# Patient Record
Sex: Female | Born: 2000 | Race: White | Hispanic: No | Marital: Single | Smoking: Current every day smoker
Health system: Southern US, Community
[De-identification: ages and names within clinical notes are randomized; demographics above are authoritative.]

## PROBLEM LIST (undated history)

## (undated) DIAGNOSIS — F419 Anxiety disorder, unspecified: Secondary | ICD-10-CM

## (undated) DIAGNOSIS — E079 Disorder of thyroid, unspecified: Secondary | ICD-10-CM

## (undated) DIAGNOSIS — F32A Depression, unspecified: Secondary | ICD-10-CM

## (undated) DIAGNOSIS — F329 Major depressive disorder, single episode, unspecified: Secondary | ICD-10-CM

## (undated) HISTORY — PX: CHOLECYSTECTOMY: SHX55

## (undated) HISTORY — PX: SEPTOPLASTY: SUR1290

## (undated) HISTORY — DX: Anxiety disorder, unspecified: F41.9

## (undated) HISTORY — PX: BUNIONECTOMY: SHX129

## (undated) HISTORY — PX: FRACTURE SURGERY: SHX138

---
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
## 2020-06-09 ENCOUNTER — Other Ambulatory Visit: Payer: Self-pay

## 2020-06-09 ENCOUNTER — Ambulatory Visit: Payer: Self-pay

## 2020-06-09 ENCOUNTER — Encounter: Payer: Self-pay | Admitting: Emergency Medicine

## 2020-06-09 ENCOUNTER — Ambulatory Visit
Admission: EM | Admit: 2020-06-09 | Discharge: 2020-06-09 | Disposition: A | Discharge status: Home / Self Care | Payer: BC Managed Care – PPO

## 2020-06-09 DIAGNOSIS — L0591 Pilonidal cyst without abscess: Secondary | ICD-10-CM | POA: Diagnosis not present

## 2020-06-09 HISTORY — DX: Disorder of thyroid, unspecified: E07.9

## 2020-06-09 HISTORY — DX: Depression, unspecified: F32.A

## 2020-06-09 MED ORDER — DOXYCYCLINE HYCLATE 100 MG PO CAPS: 100.0000 mg | ORAL_CAPSULE | Freq: Two times a day (BID) | ORAL | 0 refills | Status: DC

## 2020-06-09 NOTE — Discharge Instructions (Addendum)
Keep area(s) clean and dry. Apply hot compress / towel for 5-10 minutes 3-5 times daily. Take antibiotic as prescribed with food - important to complete course. Return for worsening pain, redness, swelling, discharge, fever 

## 2020-06-10 ENCOUNTER — Encounter: Payer: Self-pay | Admitting: Emergency Medicine

## 2020-06-10 NOTE — ED Provider Notes (Signed)
[facility]    CSN: [id number] Arrival date & time: 06/09/20  1659      History   Chief Complaint Chief Complaint  Patient presents with  . Abscess    HPI Myalynn Lingle is a 19 y.o. female.   With history of obesity presenting for gluteal cleft pain, swelling.  Denies history of pilonidal cyst, though does have history of abscesses.  No change in bowel or bladder habit, abdominal pain, back pain, fever.  Has not tried thing for this.  Past Medical History:  Diagnosis Date  . Depression   . Thyroid disease     There are no problems to display for this patient.   History reviewed. No pertinent surgical history.  OB History   No obstetric history on file.      Home Medications    Prior to Admission medications   Medication Sig Start Date End Date Taking? Authorizing Provider  ARIPiprazole (ABILIFY) 15 MG tablet Take 15 mg by mouth daily.   Yes [provider]  levothyroxine (SYNTHROID) 100 MCG tablet Take 100 mcg by mouth daily before breakfast.   Yes [provider]  doxycycline (VIBRAMYCIN) 100 MG capsule Take 1 capsule (100 mg total) by mouth 2 (two) times daily. 06/09/20   Hall-Potvin, [place], PA-C    Family History History reviewed. No pertinent family history.  Social History Social History   Tobacco Use  . Smoking status: Never Smoker  . Smokeless tobacco: Never Used  Substance Use Topics  . Alcohol use: Not Currently  . Drug use: Never     Allergies   Patient has no known allergies.   Review of Systems As per HPI   Physical Exam Triage Vital Signs ED Triage Vitals  Enc Vitals Group     BP 06/09/20 1731 (!) 146/82     Pulse Rate 06/09/20 1731 77     Resp 06/09/20 1731 18     Temp 06/09/20 1731 98.4 F (36.9 C)     Temp Source 06/09/20 1731 Oral     SpO2 06/09/20 1731 95 %     Weight --      Height --      Head Circumference --      Peak Flow --      Pain Score 06/09/20 1732 7     Pain Loc --       Pain Edu? --      Excl. in GC? --    No data found.  Updated Vital Signs BP (!) 146/82 (BP Location: Right Arm)   Pulse 77   Temp 98.4 F (36.9 C) (Oral)   Resp 18   SpO2 95%   Visual Acuity Right Eye Distance:   Left Eye Distance:   Bilateral Distance:    Right Eye Near:   Left Eye Near:    Bilateral Near:     Physical Exam Constitutional:      General: She is not in acute distress. HENT:     Head: Normocephalic and atraumatic.  Eyes:     General: No scleral icterus.    Pupils: Pupils are equal, round, and reactive to light.  Cardiovascular:     Rate and Rhythm: Normal rate.  Pulmonary:     Effort: Pulmonary effort is normal.  Skin:    Coloration: Skin is not jaundiced or pale.     Comments: 1 cm area of induration at top of gluteal cleft.  No surrounding erythema or warmth, though patient does have  TTP.  Neurological:     Mental Status: She is alert and oriented to person, place, and time.      UC Treatments / Results  Labs (all labs ordered are listed, but only abnormal results are displayed) Labs Reviewed - No data to display  EKG   Radiology No results found.  Procedures Procedures (including critical care time)  Medications Ordered in UC Medications - No data to display  Initial Impression / Assessment and Plan / UC Course  I have reviewed the triage vital signs and the nursing notes.  Pertinent labs & imaging results that were available during my care of the patient were reviewed by me and considered in my medical decision making (see chart for details).     Patient with pilonidal cyst: We will start doxycycline to cover for possible underlying infection.  Lesion is small and indurated: We will defer I&D at this time.  Provided contact information for surgical follow-up if needed.  Return precautions discussed, pt verbalized understanding and is agreeable to plan. Final Clinical Impressions(s) / UC Diagnoses   Final diagnoses:  Infected  pilonidal cyst     Discharge Instructions     Keep area(s) clean and dry. Apply hot compress / towel for 5-10 minutes 3-5 times daily. Take antibiotic as prescribed with food - important to complete course. Return for worsening pain, redness, swelling, discharge, fever.    ED Prescriptions    Medication Sig Dispense Auth. Provider   doxycycline (VIBRAMYCIN) 100 MG capsule Take 1 capsule (100 mg total) by mouth 2 (two) times daily. 20 capsule Hall-Potvin, [place], PA-C     PDMP not reviewed this encounter.   Hall-Potvin, [place], [place] 06/10/20 1338

## 2020-11-16 ENCOUNTER — Ambulatory Visit (HOSPITAL_COMMUNITY)
Admission: RE | Admit: 2020-11-16 | Discharge: 2020-11-16 | Disposition: A | Discharge status: Home / Self Care | Payer: 59 | Attending: Psychiatry | Admitting: Psychiatry

## 2020-11-16 MED ORDER — ARIPIPRAZOLE 5 MG PO TABS
5.0000 mg | ORAL_TABLET | Freq: Every day | ORAL | 0 refills | Status: DC
Start: 2020-11-17 — End: 2021-01-20

## 2020-11-16 MED ORDER — MIRTAZAPINE 30 MG PO TABS
30.0000 mg | ORAL_TABLET | Freq: Every day | ORAL | Status: DC
  Filled 2020-11-16: qty 1

## 2020-11-16 MED ORDER — ARIPIPRAZOLE 5 MG PO TABS
5.0000 mg | ORAL_TABLET | Freq: Every day | ORAL | Status: DC
  Filled 2020-11-16 (×2): qty 1

## 2020-11-16 MED ORDER — MIRTAZAPINE 30 MG PO TABS: 30.0000 mg | ORAL_TABLET | Freq: Every day | ORAL | 0 refills | Status: DC

## 2020-11-16 NOTE — H&P (Signed)
Behavioral Health Medical Screening Exam  Amber Rivera is an 20 y.o. female, presenting for depression with suicidal ideation for one month. She denies any suicidal plan or intent and contracts for safety. She was seen previously by psychiatry and counseling but does not have any providers locally since moving to the area this past summer. She reports previously taking Remeron and Abilify which were helpful, but she recently ran out of refills. Denies HI/AVH. Denies access to firearms or weapons. We discussed PHP/IOP as options, but patient states these will not work with her work schedule. Will send Remeron and Abilify prescription to patient's pharmacy and provided with outpatient psychiatry and counseling referrals.  Total Time spent with patient: 20 minutes  Psychiatric Specialty Exam: Physical Exam Vitals reviewed.  Constitutional:      Appearance: She is well-developed and well-nourished.  Cardiovascular:     Rate and Rhythm: Normal rate.  Pulmonary:     Effort: Pulmonary effort is normal.  Neurological:     Mental Status: She is alert and oriented to person, place, and time.    Review of Systems  Constitutional: Negative.   Respiratory: Negative for cough and shortness of breath.   Psychiatric/Behavioral: Positive for dysphoric mood and suicidal ideas (no plan or intent). Negative for agitation, behavioral problems, confusion, decreased concentration, hallucinations, self-injury and sleep disturbance. The patient is nervous/anxious. The patient is not hyperactive.    Blood pressure 124/73, pulse 89, temperature 97.9 F (36.6 C), temperature source Oral, resp. rate 18, SpO2 99 %.There is no height or weight on file to calculate BMI. General Appearance: Casual Eye Contact:  Good Speech:  Normal Rate Volume:  Normal Mood:  Anxious and Depressed Affect:  Congruent Thought Process:  Coherent and Goal Directed Orientation:  Full (Time, Place, and Person) Thought Content:   Logical Suicidal Thoughts:  Yes.  without intent/plan Homicidal Thoughts:  No Memory:  Immediate;   Good Recent;   Good Remote;   Good Judgement:  Intact Insight:  Fair Psychomotor Activity:  Normal Concentration: Concentration: Good and Attention Span: Good Recall:  Good Fund of Knowledge:Fair Language: Good Akathisia:  No Handed:  Right AIMS (if indicated):    Assets:  Communication Skills Desire for Improvement Financial Resources/Insurance Housing Physical Health Social Support Sleep:     Musculoskeletal: Strength & Muscle Tone: within normal limits Gait & Station: normal Patient leans: N/A  Blood pressure 124/73, pulse 89, temperature 97.9 F (36.6 C), temperature source Oral, resp. rate 18, SpO2 99 %.  Recommendations: Based on my evaluation the patient does not appear to have an emergency medical condition.  Remeron and Abilify rx sent to [facility] on [address]. Outpatient referrals.  Aldean Baker, NP 11/16/2020, 12:40 PM

## 2020-11-16 NOTE — BH Assessment (Addendum)
Comprehensive Clinical Assessment (CCA) Note  Patient is a 20 y/o female with history of depression. States that she was at work today and "started to feel like I was under water". She experienced increased anxiety and became overwhelmed. She called her mother and made her aware of how she was feeling. Also, informed her mother that she felt suicidal. Patient was brought to [facility] by her mother for a crises assessment.   Patient with current suicidal ideations. No plan. No intent. No access to means. Protective factors from harming herself is the relationship she has with her family. She denies access to means and is able to to contract for safety. Reports a history of self mutilating behaviors (cutting) with a razor blade. The last incident occurred 7-8 yrs ago. Current depressive symptoms include: guilt, tearful, worthlessness, guilt, and isolating self from others. Appetite is poor. She sleeps 6-7 hrs per night and has difficulty falling asleep. She denies a family history of mental health illnesses. No history of trauma and/or abuse. Support system is her family. She currently lives with her parents and #2 siblings.  Works at a [employer] and has a H.S. Diploma.   She denies homicidal ideations. No history of aggressive and/or assaultive behaviors. Denies legal issues and/or criminal charges pending. No court dates. Denies AVH's. She reports daily THC use for the past 1-2 weeks. When asked her amount of use per day she was unable to answer. Denies alcohol use. Patient was [place] to [place] psychiatric facility in [place]  ---2017 (2 hospitalizations). The reason for the hospitalizations were due to suicidal ideations, depression, and self mutilating behaviors. She does not have a current outpatient therapist and/or psychiatrist. However, was seeing a provider when she lived in [place]. She has not seen a provider since July 2021. She has not had any psychotropic medications x3 months. States that  she ran out and doesn't have any more refills for a prescription. She was prescribed Remeron and Abilify.   Patient is oriented x5. Speech is normal. Affect is sad/deprsessed. Mood is congruent with affect.  Insight and judgement are fair.She does not appear to be responding to internal stimuli.   Disposition:  Marciano SequinJanet Sykes, NP, psych cleared patient for discharge home. We discussed PHP/IOP as options, but patient states these will not work with her work schedule. Marciano SequinJanet Sykes, NP, agreed to send Remeron and Abilify prescription to patient's pharmacy and she was provided with outpatient psychiatry and counseling referrals. Patient agreed to follow up with those referrals.         11/16/2020 Dietrich Patesierra Hufstetler [id number]  Chief Complaint:  Chief Complaint  Patient presents with  . Psychiatric Evaluation   Visit Diagnosis: Major Depressive Disorder, Recurrent, Severe without psychotic Features, Anxiety Disorder, Cannibas Use Disorder   CCA Screening, Triage and Referral (STR)  Patient Reported Information How did you hear about us? Family/Friend  Referral name: mother- Erick AlleyMichelle Settlemyre  Referral phone number: [phone])   Whom do you see for routine medical problems? I don't have a doctor  Practice/Facility Name: No data recorded Practice/Facility Phone Number: No data recorded Name of Contact: No data recorded Contact Number: No data recorded Contact Fax Number: No data recorded Prescriber Name: No data recorded Prescriber Address (if known): No data recorded  What Is the Reason for Your Visit/Call Today? Depressed and Suicidal; Ran out of psychotrophics 3 months ago.  How Long Has This Been Causing You Problems? > than 6 months  What Do You Feel Would  Help You the Most Today? No data recorded  Have You Recently Been in Any Inpatient Treatment (Hospital/Detox/Crisis Center/28-Day Program)? No  Name/Location of Program/Hospital:No data recorded How Long Were You There? No  data recorded When Were You Discharged? No data recorded  Have You Ever Received Services [facility]? No  Who Do You See at [facility]? No data recorded  Have You Recently Had Any Thoughts About Hurting Yourself? Yes  Are You Planning to Commit Suicide/Harm Yourself At This time? No   Have you Recently Had Thoughts About Hurting Someone Karolee Ohslse? No  Explanation: No data recorded  Have You Used Any Alcohol or Drugs in the Past 24 Hours? No  How Long Ago Did You Use Drugs or Alcohol? No data recorded What Did You Use and How Much? No data recorded  Do You Currently Have a Therapist/Psychiatrist? No  Name of Therapist/Psychiatrist: No data recorded  Have You Been Recently Discharged From Any Office Practice or Programs? No  Explanation of Discharge From Practice/Program: No data recorded    CCA Screening Triage Referral Assessment Type of Contact: No data recorded Is this Initial or Reassessment? No data recorded Date Telepsych consult ordered in [facility]:  No data recorded Time Telepsych consult ordered in [facility]:  No data recorded  Patient Reported Information Reviewed? Yes  Patient Left Without Being Seen? No data recorded Reason for Not Completing Assessment: No data recorded  Collateral Involvement: No data recorded  Does Patient Have a Court Appointed Legal Guardian? No data recorded Name and Contact of Legal Guardian: No data recorded If Minor and Not Living with Parent(s), Who has Custody? No data recorded Is CPS involved or ever been involved? Never  Is APS involved or ever been involved? Never   Patient Determined To Be At Risk for Harm To Self or Others Based on Review of Patient Reported Information or Presenting Complaint? No  Method: No data recorded Availability of Means: No data recorded Intent: No data recorded Notification Required: No data recorded Additional Information for Danger to Others Potential: No data recorded Additional Comments  for Danger to Others Potential: No data recorded Are There Guns or Other Weapons in Your Home? No data recorded Types of Guns/Weapons: No data recorded Are These Weapons Safely Secured?                            No data recorded Who Could Verify You Are Able To Have These Secured: No data recorded Do You Have any Outstanding Charges, Pending Court Dates, Parole/Probation? No data recorded Contacted To Inform of Risk of Harm To Self or Others: No data recorded  Location of Assessment: -- ([facility])   Does Patient Present under Involuntary Commitment? No  IVC Papers Initial File Date: No data recorded  [place]   Patient Currently Receiving the Following Services: -- (Patient does not have any current outpatient services)   Determination of Need: Urgent (48 hours)   Options For Referral: Intensive Outpatient Therapy; Medication Management; Outpatient Therapy; Partial Hospitalization     CCA Biopsychosocial Intake/Chief Complaint:  No data recorded Current Symptoms/Problems: Suicidal and Depressed; "I ran out of my medications 3 months ago"   Patient Reported Schizophrenia/Schizoaffective Diagnosis in Past: No   Strengths: unk  Preferences: unk  Abilities: unk   Type of Services Patient Feels are Needed: unk   Initial Clinical Notes/Concerns: unk   Mental Health Symptoms Depression:  Difficulty  Concentrating; Hopelessness; Fatigue; Change in energy/activity; Sleep (too much or little); Tearfulness; Increase/decrease in appetite; Worthlessness   Duration of Depressive symptoms: Less than two weeks   Mania:  None   Anxiety:   Difficulty concentrating; Worrying; Restlessness   Psychosis:  None   Duration of Psychotic symptoms: No data recorded  Trauma:  None   Obsessions:  None   Compulsions:  None   Inattention:  Forgetful   Hyperactivity/Impulsivity:  N/A   Oppositional/Defiant Behaviors:  None   Emotional Irregularity:   Chronic feelings of emptiness; Mood lability   Other Mood/Personality Symptoms:  No data recorded   Mental Status Exam Appearance and self-care  Stature:  Average   Weight:  Obese   Clothing:  Casual   Grooming:  Normal   Cosmetic use:  Age appropriate   Posture/gait:  Normal   Motor activity:  Not Remarkable   Sensorium  Attention:  Normal   Concentration:  Normal   Orientation:  X5   Recall/memory:  Normal   Affect and Mood  Affect:  Depressed; Flat   Mood:  Depressed   Relating  Eye contact:  Normal   Facial expression:  Depressed   Attitude toward examiner:  Uninterested   Thought and Language  Speech flow: Normal   Thought content:  Appropriate to Mood and Circumstances   Preoccupation:  None   Hallucinations:  None   Organization:  No data recorded  [employer] of Knowledge:  Average   Intelligence:  Average   Abstraction:  Normal   Judgement:  Good   Reality Testing:  Adequate   Insight:  Good   Decision Making:  Normal   Social Functioning  Social Maturity:  Responsible   Social Judgement:  Normal   Stress  Stressors:  Other (Comment); Transitions (patient does not identify with any stressors; mentions that she moved from [place] June 2022)   Coping Ability:  Normal   Skill Deficits:  Communication; Decision making   Supports:  Family; Friends/Service system     Religion: Religion/Spirituality How Might This Affect Treatment?: unk  Leisure/Recreation: Leisure / Recreation Do You Have Hobbies?:  (unk)  Exercise/Diet: Exercise/Diet Do You Exercise?:  (unk) Have You Gained or Lost A Significant Amount of Weight in the Past Six Months?:  (unk) Do You Follow a Special Diet?:  (unk) Do You Have Any Trouble Sleeping?:  (unk)   CCA Employment/Education Employment/Work Situation: Employment / Work Situation Employment situation: Employed Where is patient currently employed?: "I work at a [employer], [employer]" How long has patient been employed?: unk Patient's job has been impacted by current illness: Yes Describe how patient's job has been impacted: Patient states, "I felt like I was under water". She called her mother and left work early due to suicidal ideations. What is the longest time patient has a held a job?: unk Where was the patient employed at that time?: Yes Has patient ever been in the [employer]?: No  Education: Education Is Patient Currently Attending School?: No Last Grade Completed:  (completed HS) Name of High School: n/a Did Garment/textile technologist From [employer]?: Yes Did You Attend College?: No Did You Attend Graduate School?: No Did You Have Any Special Interests In School?: unk Did You Have An Individualized Education Program (IIEP):  (unk) Did You Have Any Difficulty At School?:  (unk) Patient's Education Has Been Impacted by Current Illness:  (unk)   CCA Family/Childhood History Family and Relationship History: Family history Are you sexually  active?:  (unk) What is your sexual orientation?: unk Has your sexual activity been affected by drugs, alcohol, medication, or emotional stress?: unk Does patient have children?: No  Childhood History:  Childhood History By whom was/is the patient raised?: Both parents Additional childhood history information: unk Description of patient's relationship with caregiver when they were a child: unk Patient's description of current relationship with people who raised him/her: unk How were you disciplined when you got in trouble as a child/adolescent?: unk Does patient have siblings?:  (unk) Did patient suffer any verbal/emotional/physical/sexual abuse as a child?:  (unk) Did patient suffer from severe childhood neglect?:  (unk) Has patient ever been sexually abused/assaulted/raped as an adolescent or adult?:  (unk) Was the patient ever a victim of a crime or a disaster?:  (unk) Witnessed domestic violence?:  (unk) Has  patient been affected by domestic violence as an adult?:  (unk)  Child/Adolescent Assessment:     CCA Substance Use Alcohol/Drug Use: Alcohol / Drug Use Pain Medications: SEE MAR Prescriptions: SEE MAR Over the Counter: SEE MAR History of alcohol / drug use?: Yes Longest period of sobriety (when/how long): unk Substance #1 Name of Substance 1: THC 1 - Age of First Use: 20 yrs old 1 - Amount (size/oz): "I don't know" 1 - Frequency: daily for the past 2 weeks 1 - Duration: on-going 1 - Last Use / Amount: 11/15/2020                       ASAM's:  Six Dimensions of Multidimensional Assessment  Dimension 1:  Acute Intoxication and/or Withdrawal Potential:      Dimension 2:  Biomedical Conditions and Complications:      Dimension 3:  Emotional, Behavioral, or Cognitive Conditions and Complications:     Dimension 4:  Readiness to Change:     Dimension 5:  Relapse, Continued use, or Continued Problem Potential:     Dimension 6:  Recovery/Living Environment:     ASAM Severity Score:    ASAM Recommended Level of Treatment:     Substance use Disorder (SUD)    Recommendations for Services/Supports/Treatments:    DSM5 Diagnoses: There are no problems to display for this patient.   Patient Centered Plan: Patient is on the following Treatment Plan(s):  Anxiety, Depression and Substance Abuse   Referrals to Alternative Service(s): Referred to Alternative Service(s):   Place:   Date:   Time:    Referred to Alternative Service(s):   Place:   Date:   Time:    Referred to Alternative Service(s):   Place:   Date:   Time:    Referred to Alternative Service(s):   Place:   Date:   Time:     Melynda Ripple, CounselorComprehensive Clinical Assessment (CCA) Screening, Triage and Referral Note  11/16/2020 Saadiya Wilfong [id number]  Chief Complaint:  Chief Complaint  Patient presents with  . Psychiatric Evaluation   Visit Diagnosis: Major Depressive Disorder, Recurrent, Severe  without psychotic Features, Anxiety Disorder, Cannibas Use Disorder  Patient Reported Information How did you hear about [place]? Family/Friend   Referral name: mother- Luciana Cammarata   Referral phone number: [phone])  Whom do you see for routine medical problems? I don't have a doctor   Practice/Facility Name: No data recorded  Practice/Facility Phone Number: No data recorded  Name of Contact: No data recorded  Contact Number: No data recorded  Contact Fax Number: No data recorded  Prescriber Name: No data recorded  Prescriber Address (if  known): No data recorded What Is the Reason for Your Visit/Call Today? Depressed and Suicidal; Ran out of psychotrophics 3 months ago.  How Long Has This Been Causing You Problems? > than 6 months  Have You Recently Been in Any Inpatient Treatment (Hospital/Detox/Crisis Center/28-Day Program)? No   Name/Location of Program/Hospital:No data recorded  How Long Were You There? No data recorded  When Were You Discharged? No data recorded Have You Ever Received Services From [facility] Before? No   Who Do You See at [facility]? No data recorded Have You Recently Had Any Thoughts About Hurting Yourself? Yes   Are You Planning to Commit Suicide/Harm Yourself At This time?  No  Have you Recently Had Thoughts About Hurting Someone Karolee Ohs? No   Explanation: No data recorded Have You Used Any Alcohol or Drugs in the Past 24 Hours? No   How Long Ago Did You Use Drugs or Alcohol?  No data recorded  What Did You Use and How Much? No data recorded What Do You Feel Would Help You the Most Today? No data recorded Do You Currently Have a Therapist/Psychiatrist? No   Name of Therapist/Psychiatrist: No data recorded  Have You Been Recently Discharged From Any Office Practice or Programs? No   Explanation of Discharge From Practice/Program:  No data recorded    CCA Screening Triage Referral Assessment Type of Contact: No data recorded  Is this  Initial or Reassessment? No data recorded  Date Telepsych consult ordered in [facility]:  No data recorded  Time Telepsych consult ordered in [facility]:  No data recorded Patient Reported Information Reviewed? Yes   Patient Left Without Being Seen? No data recorded  Reason for Not Completing Assessment: No data recorded Collateral Involvement: No data recorded Does Patient Have a Court Appointed Legal Guardian? No data recorded  Name and Contact of Legal Guardian:  No data recorded If Minor and Not Living with Parent(s), Who has Custody? No data recorded Is CPS involved or ever been involved? Never  Is APS involved or ever been involved? Never  Patient Determined To Be At Risk for Harm To Self or Others Based on Review of Patient Reported Information or Presenting Complaint? No   Method: No data recorded  Availability of Means: No data recorded  Intent: No data recorded  Notification Required: No data recorded  Additional Information for Danger to Others Potential:  No data recorded  Additional Comments for Danger to Others Potential:  No data recorded  Are There Guns or Other Weapons in Your Home?  No data recorded   Types of Guns/Weapons: No data recorded   Are These Weapons Safely Secured?                              No data recorded   Who Could Verify You Are Able To Have These Secured:    No data recorded Do You Have any Outstanding Charges, Pending Court Dates, Parole/Probation? No data recorded Contacted To Inform of Risk of Harm To Self or Others: No data recorded Location of Assessment: -- (walk in at [facility])  Does Patient Present under Involuntary Commitment? No   IVC Papers Initial File Date: No data recorded  [place] of Residence: [place]  Patient Currently Receiving the Following Services: -- (Patient does not have any current outpatient services)   Determination of Need: Urgent (48 hours)   Options For Referral: Intensive Outpatient Therapy; Medication Management; Outpatient  Therapy; Partial  Hospitalization   Melynda Ripple, Counselor

## 2021-01-20 ENCOUNTER — Encounter (HOSPITAL_COMMUNITY): Payer: Self-pay | Admitting: Psychiatry

## 2021-01-20 ENCOUNTER — Telehealth (INDEPENDENT_AMBULATORY_CARE_PROVIDER_SITE_OTHER): Payer: 59 | Admitting: Psychiatry

## 2021-01-20 DIAGNOSIS — F411 Generalized anxiety disorder: Secondary | ICD-10-CM | POA: Diagnosis not present

## 2021-01-20 DIAGNOSIS — F331 Major depressive disorder, recurrent, moderate: Secondary | ICD-10-CM | POA: Diagnosis not present

## 2021-01-20 MED ORDER — BUSPIRONE HCL 7.5 MG PO TABS: 7.5000 mg | ORAL_TABLET | Freq: Every day | ORAL | 0 refills | Status: DC

## 2021-01-20 MED ORDER — MIRTAZAPINE 30 MG PO TABS: 30.0000 mg | ORAL_TABLET | Freq: Every day | ORAL | 0 refills | Status: DC

## 2021-01-20 MED ORDER — ARIPIPRAZOLE 10 MG PO TABS
10.0000 mg | ORAL_TABLET | Freq: Every day | ORAL | 0 refills | Status: DC
Start: 2021-01-20 — End: 2022-05-10
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
## 2021-02-16 ENCOUNTER — Telehealth (HOSPITAL_COMMUNITY): Payer: 59 | Admitting: Psychiatry

## 2021-10-06 ENCOUNTER — Emergency Department (HOSPITAL_COMMUNITY)
Admission: EM | Admit: 2021-10-06 | Discharge: 2021-10-06 | Disposition: A | Discharge status: Home / Self Care | Payer: 59 | Attending: Emergency Medicine | Admitting: Emergency Medicine

## 2021-10-06 ENCOUNTER — Other Ambulatory Visit: Payer: Self-pay

## 2021-10-06 ENCOUNTER — Encounter (HOSPITAL_COMMUNITY): Payer: Self-pay | Admitting: *Deleted

## 2021-10-06 DIAGNOSIS — E860 Dehydration: Secondary | ICD-10-CM | POA: Diagnosis not present

## 2021-10-06 DIAGNOSIS — J020 Streptococcal pharyngitis: Secondary | ICD-10-CM | POA: Diagnosis present

## 2021-10-06 DIAGNOSIS — J029 Acute pharyngitis, unspecified: Secondary | ICD-10-CM

## 2021-10-06 LAB — CBC WITH DIFFERENTIAL/PLATELET
Abs Immature Granulocytes: 0.06 10*3/uL (ref 0.00–0.07)
Basophils Absolute: 0 10*3/uL (ref 0.0–0.1)
Basophils Relative: 0 %
Eosinophils Absolute: 0 10*3/uL (ref 0.0–0.5)
Eosinophils Relative: 0 %
HCT: 39.8 % (ref 36.0–46.0)
Hemoglobin: 13.2 g/dL (ref 12.0–15.0)
Immature Granulocytes: 1 %
Lymphocytes Relative: 10 %
Lymphs Abs: 1.3 10*3/uL (ref 0.7–4.0)
MCH: 29.3 pg (ref 26.0–34.0)
MCHC: 33.2 g/dL (ref 30.0–36.0)
MCV: 88.2 fL (ref 80.0–100.0)
Monocytes Absolute: 0.9 10*3/uL (ref 0.1–1.0)
Monocytes Relative: 7 %
Neutro Abs: 10.9 10*3/uL — ABNORMAL HIGH (ref 1.7–7.7)
Neutrophils Relative %: 82 %
Platelets: 270 10*3/uL (ref 150–400)
RBC: 4.51 MIL/uL (ref 3.87–5.11)
RDW: 13.4 % (ref 11.5–15.5)
WBC: 13.1 10*3/uL — ABNORMAL HIGH (ref 4.0–10.5)
nRBC: 0 % (ref 0.0–0.2)

## 2021-10-06 LAB — COMPREHENSIVE METABOLIC PANEL
ALT: 17 U/L (ref 0–44)
AST: 15 U/L (ref 15–41)
Albumin: 3.8 g/dL (ref 3.5–5.0)
Alkaline Phosphatase: 64 U/L (ref 38–126)
Anion gap: 11 (ref 5–15)
BUN: 7 mg/dL (ref 6–20)
CO2: 25 mmol/L (ref 22–32)
Calcium: 8.8 mg/dL — ABNORMAL LOW (ref 8.9–10.3)
Chloride: 97 mmol/L — ABNORMAL LOW (ref 98–111)
Creatinine, Ser: 0.58 mg/dL (ref 0.44–1.00)
GFR, Estimated: >60 mL/min (ref >60)
Glucose, Bld: 107 mg/dL — ABNORMAL HIGH (ref 70–99)
Potassium: 3.4 mmol/L — ABNORMAL LOW (ref 3.5–5.1)
Sodium: 133 mmol/L — ABNORMAL LOW (ref 135–145)
Total Bilirubin: 0.6 mg/dL (ref 0.3–1.2)
Total Protein: 8.3 g/dL — ABNORMAL HIGH (ref 6.5–8.1)

## 2021-10-06 LAB — MONONUCLEOSIS SCREEN: Mono Screen: NEGATIVE

## 2021-10-06 MED ORDER — ONDANSETRON HCL 4 MG/2ML IJ SOLN
4.0000 mg | Freq: Once | INTRAMUSCULAR | Status: AC
  Administered 2021-10-06: 4 mg via INTRAVENOUS
  Filled 2021-10-06: qty 2

## 2021-10-06 MED ORDER — SODIUM CHLORIDE 0.9 % IV BOLUS
1000.0000 mL | Freq: Once | INTRAVENOUS | Status: AC
  Administered 2021-10-06: 1000 mL via INTRAVENOUS

## 2021-10-06 MED ORDER — KETOROLAC TROMETHAMINE 30 MG/ML IJ SOLN
30.0000 mg | Freq: Once | INTRAMUSCULAR | Status: AC
  Administered 2021-10-06: 30 mg via INTRAVENOUS
  Filled 2021-10-06: qty 1

## 2021-10-06 MED ORDER — SODIUM CHLORIDE 0.9 % IV SOLN
2.0000 g | Freq: Once | INTRAVENOUS | Status: AC
  Administered 2021-10-06: 2 g via INTRAVENOUS
  Filled 2021-10-06: qty 20

## 2021-10-06 NOTE — ED Provider Notes (Signed)
[facility] Provider Note   CSN: [id number] Arrival date & time: 10/06/21  [id number]     History Chief Complaint  Patient presents with   Sore Throat    Amber Rivera is a 20 y.o. female.  Patient has been treated for strep pharyngitis since Monday.  She states she is having a hard time keeping fluids and and her throat is still hurting.  The history is provided by the patient and medical records. No language interpreter was used.  Sore Throat This is a recurrent problem. The current episode started more than 2 days ago. The problem occurs constantly. The problem has not changed since onset.Pertinent negatives include no chest pain, no abdominal pain and no headaches. Nothing aggravates the symptoms. Nothing relieves the symptoms. She has tried nothing for the symptoms. The treatment provided no relief.      Past Medical History:  Diagnosis Date   Depression    Thyroid disease     There are no problems to display for this patient.   Past Surgical History:  Procedure Laterality Date   CHOLECYSTECTOMY     FRACTURE SURGERY Right    SEPTOPLASTY       OB History     Gravida  0   Para  0   Term  0   Preterm  0   AB  0   Living  0      SAB  0   IAB  0   Ectopic  0   Multiple  0   Live Births  0           No family history on file.  Social History   Tobacco Use   Smoking status: Never   Smokeless tobacco: Never  Vaping Use   Vaping Use: Every day   Substances: Nicotine  Substance Use Topics   Alcohol use: Yes   Drug use: Yes    Types: Marijuana    Home Medications Prior to Admission medications   Medication Sig Start Date End Date Taking? Authorizing Provider  ARIPiprazole (ABILIFY) 10 MG tablet Take 1 tablet (10 mg total) by mouth daily. 01/20/21   Merian Capron, MD  busPIRone (BUSPAR) 7.5 MG tablet Take 1 tablet (7.5 mg total) by mouth daily. 01/20/21   Merian Capron, MD  levothyroxine (SYNTHROID) 100 MCG tablet  Take 100 mcg by mouth daily before breakfast.    [provider]  mirtazapine (REMERON) 30 MG tablet Take 1 tablet (30 mg total) by mouth at bedtime. 01/20/21   Merian Capron, MD    Allergies    Patient has no known allergies.  Review of Systems   Review of Systems  Constitutional:  Negative for appetite change and fatigue.  HENT:  Negative for congestion, ear discharge and sinus pressure.        Sore throat  Eyes:  Negative for discharge.  Respiratory:  Negative for cough.   Cardiovascular:  Negative for chest pain.  Gastrointestinal:  Negative for abdominal pain and diarrhea.  Genitourinary:  Negative for frequency and hematuria.  Musculoskeletal:  Negative for back pain.  Skin:  Negative for rash.  Neurological:  Negative for seizures and headaches.  Psychiatric/Behavioral:  Negative for hallucinations.    Physical Exam Updated Vital Signs BP 126/66    Pulse 82    Temp 98.9 F (37.2 C) (Oral)    Resp 18    Ht 5\' 2"  (1.575 m)    Wt 122.5 kg    LMP  (  Within Weeks)    SpO2 98%    BMI 49.38 kg/m   Physical Exam Vitals and nursing note reviewed.  Constitutional:      Appearance: She is well-developed.  HENT:     Head: Normocephalic.     Nose: Nose normal.     Mouth/Throat:     Comments: Mildly inflamed pharynx Eyes:     General: No scleral icterus.    Conjunctiva/sclera: Conjunctivae normal.  Neck:     Thyroid: No thyromegaly.  Cardiovascular:     Rate and Rhythm: Normal rate and regular rhythm.     Heart sounds: No murmur heard.   No friction rub. No gallop.  Pulmonary:     Breath sounds: No stridor. No wheezing or rales.  Chest:     Chest wall: No tenderness.  Abdominal:     General: There is no distension.     Tenderness: There is no abdominal tenderness. There is no rebound.  Musculoskeletal:        General: Normal range of motion.     Cervical back: Neck supple.  Lymphadenopathy:     Cervical: No cervical adenopathy.  Skin:    Findings: No  erythema or rash.  Neurological:     Mental Status: She is alert and oriented to person, place, and time.     Motor: No abnormal muscle tone.     Coordination: Coordination normal.  Psychiatric:        Behavior: Behavior normal.    ED Results / Procedures / Treatments   Labs (all labs ordered are listed, but only abnormal results are displayed) Labs Reviewed  CBC WITH DIFFERENTIAL/PLATELET - Abnormal; Notable for the following components:      Result Value   WBC 13.1 (*)    Neutro Abs 10.9 (*)    All other components within normal limits  COMPREHENSIVE METABOLIC PANEL - Abnormal; Notable for the following components:   Sodium 133 (*)    Potassium 3.4 (*)    Chloride 97 (*)    Glucose, Bld 107 (*)    Calcium 8.8 (*)    Total Protein 8.3 (*)    All other components within normal limits  MONONUCLEOSIS SCREEN    EKG None  Radiology No results found.  Procedures Procedures   Medications Ordered in ED Medications  sodium chloride 0.9 % bolus 1,000 mL (1,000 mLs Intravenous New Bag/Given 10/06/21 0914)  ondansetron (ZOFRAN) injection 4 mg (4 mg Intravenous Given 10/06/21 0917)  ketorolac (TORADOL) 30 MG/ML injection 30 mg (30 mg Intravenous Given 10/06/21 0919)  cefTRIAXone (ROCEPHIN) 2 g in sodium chloride 0.9 % 100 mL IVPB (2 g Intravenous New Bag/Given 10/06/21 0915)    ED Course  I have reviewed the triage vital signs and the nursing notes.  Pertinent labs & imaging results that were available during my care of the patient were reviewed by me and considered in my medical decision making (see chart for details).    MDM Rules/Calculators/A&P                           Patient with dehydration and pharyngitis.  Patient improved with fluids.  She will continue Zithromax and have her follow-up with her PCP as needed Final Clinical Impression(s) / ED Diagnoses Final diagnoses:  Sore throat  Strep pharyngitis  Dehydration    Rx / DC Orders ED Discharge Orders      None        Ivanell Deshotel,  Broadus John, MD 10/06/21 1032

## 2021-10-06 NOTE — ED Triage Notes (Signed)
Pt was diagnosed with strep on Tuesday by urgent care and placed on antibiotics. Pt reports her tonsils have continued to swell to the point she hasn't been able to swallow any food and when she does get liquid down she vomits. Pt reports she hasn't had any solid food since Monday.

## 2021-10-06 NOTE — Discharge Instructions (Signed)
Drink plenty of fluids and follow-up with your doctor if not improving.  Continue to finish the antibiotic

## 2022-02-17 ENCOUNTER — Ambulatory Visit (INDEPENDENT_AMBULATORY_CARE_PROVIDER_SITE_OTHER): Payer: 59

## 2022-02-17 ENCOUNTER — Other Ambulatory Visit (HOSPITAL_COMMUNITY)
Admission: RE | Admit: 2022-02-17 | Discharge: 2022-02-17 | Disposition: A | Discharge status: Home / Self Care | Payer: 59 | Source: Ambulatory Visit | Attending: Family Medicine | Admitting: Family Medicine

## 2022-02-17 ENCOUNTER — Ambulatory Visit (INDEPENDENT_AMBULATORY_CARE_PROVIDER_SITE_OTHER): Payer: 59 | Admitting: Family Medicine

## 2022-02-17 ENCOUNTER — Encounter: Payer: Self-pay | Admitting: Family Medicine

## 2022-02-17 VITALS — BP 127/65 | HR 89 | Temp 98.2°F | Ht 62.0 in | Wt 246.5 lb

## 2022-02-17 DIAGNOSIS — M533 Sacrococcygeal disorders, not elsewhere classified: Secondary | ICD-10-CM

## 2022-02-17 DIAGNOSIS — F419 Anxiety disorder, unspecified: Secondary | ICD-10-CM | POA: Diagnosis not present

## 2022-02-17 DIAGNOSIS — F339 Major depressive disorder, recurrent, unspecified: Secondary | ICD-10-CM | POA: Diagnosis not present

## 2022-02-17 DIAGNOSIS — E039 Hypothyroidism, unspecified: Secondary | ICD-10-CM

## 2022-02-17 DIAGNOSIS — Z7689 Persons encountering health services in other specified circumstances: Secondary | ICD-10-CM

## 2022-02-17 DIAGNOSIS — T7421XA Adult sexual abuse, confirmed, initial encounter: Secondary | ICD-10-CM | POA: Insufficient documentation

## 2022-02-17 DIAGNOSIS — Z30011 Encounter for initial prescription of contraceptive pills: Secondary | ICD-10-CM

## 2022-02-17 LAB — PREGNANCY, URINE: Preg Test, Ur: NEGATIVE

## 2022-02-17 MED ORDER — NORGESTIMATE-ETH ESTRADIOL 0.25-35 MG-MCG PO TABS: 1.0000 | ORAL_TABLET | Freq: Every day | ORAL | 3 refills | Status: DC

## 2022-02-17 MED ORDER — PROPRANOLOL HCL 10 MG PO TABS: 10.0000 mg | ORAL_TABLET | Freq: Three times a day (TID) | ORAL | 0 refills | Status: DC | PRN
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
## 2022-02-18 LAB — CMP14+EGFR
ALT: 44 IU/L — ABNORMAL HIGH (ref 0–32)
AST: 20 IU/L (ref 0–40)
Albumin/Globulin Ratio: 1.8 (ref 1.2–2.2)
Albumin: 4.4 g/dL (ref 3.9–5.0)
Alkaline Phosphatase: 85 IU/L (ref 44–121)
BUN/Creatinine Ratio: 11 (ref 9–23)
BUN: 7 mg/dL (ref 6–20)
Bilirubin Total: 0.3 mg/dL (ref 0.0–1.2)
CO2: 20 mmol/L (ref 20–29)
Calcium: 9.5 mg/dL (ref 8.7–10.2)
Chloride: 104 mmol/L (ref 96–106)
Creatinine, Ser: 0.66 mg/dL (ref 0.57–1.00)
Globulin, Total: 2.5 g/dL (ref 1.5–4.5)
Glucose: 83 mg/dL (ref 70–99)
Potassium: 4.4 mmol/L (ref 3.5–5.2)
Sodium: 137 mmol/L (ref 134–144)
Total Protein: 6.9 g/dL (ref 6.0–8.5)
eGFR: 126 mL/min/{1.73_m2} (ref >59)

## 2022-02-18 LAB — CBC WITH DIFFERENTIAL/PLATELET
Basophils Absolute: 0 10*3/uL (ref 0.0–0.2)
Basos: 0 %
EOS (ABSOLUTE): 0.3 10*3/uL (ref 0.0–0.4)
Eos: 3 %
Hematocrit: 40.1 % (ref 34.0–46.6)
Hemoglobin: 13.1 g/dL (ref 11.1–15.9)
Immature Grans (Abs): 0 10*3/uL (ref 0.0–0.1)
Immature Granulocytes: 0 %
Lymphocytes Absolute: 2.1 10*3/uL (ref 0.7–3.1)
Lymphs: 22 %
MCH: 28.2 pg (ref 26.6–33.0)
MCHC: 32.7 g/dL (ref 31.5–35.7)
MCV: 86 fL (ref 79–97)
Monocytes Absolute: 0.5 10*3/uL (ref 0.1–0.9)
Monocytes: 5 %
Neutrophils Absolute: 6.4 10*3/uL (ref 1.4–7.0)
Neutrophils: 70 %
Platelets: 327 10*3/uL (ref 150–450)
RBC: 4.65 x10E6/uL (ref 3.77–5.28)
RDW: 12.8 % (ref 11.7–15.4)
WBC: 9.3 10*3/uL (ref 3.4–10.8)

## 2022-02-18 LAB — RPR: RPR Ser Ql: NONREACTIVE

## 2022-02-18 LAB — HEPB+HEPC+HIV PANEL
HIV Screen 4th Generation wRfx: NONREACTIVE
Hep B C IgM: NEGATIVE
Hep B Core Total Ab: NEGATIVE
Hep B E Ab: NEGATIVE
Hep B E Ag: NEGATIVE
Hep B Surface Ab, Qual: NONREACTIVE
Hep C Virus Ab: NONREACTIVE
Hepatitis B Surface Ag: NEGATIVE

## 2022-02-18 LAB — LIPID PANEL
Chol/HDL Ratio: 3.9 ratio (ref 0.0–4.4)
Cholesterol, Total: 174 mg/dL (ref 100–199)
HDL: 45 mg/dL (ref >39)
LDL Chol Calc (NIH): 116 mg/dL — ABNORMAL HIGH (ref 0–99)
Triglycerides: 67 mg/dL (ref 0–149)
VLDL Cholesterol Cal: 13 mg/dL (ref 5–40)

## 2022-02-18 LAB — THYROID PANEL WITH TSH
Free Thyroxine Index: 1.4 (ref 1.2–4.9)
T3 Uptake Ratio: 25 % (ref 24–39)
T4, Total: 5.6 ug/dL (ref 4.5–12.0)
TSH: 4.88 u[IU]/mL — ABNORMAL HIGH (ref 0.450–4.500)

## 2022-02-18 LAB — HSV(HERPES SIMPLEX VRS) I + II AB-IGG
HSV 1 Glycoprotein G Ab, IgG: 13.1 index — ABNORMAL HIGH (ref 0.00–0.90)
HSV 2 IgG, Type Spec: <0.91 index (ref 0.00–0.90)

## 2022-02-20 ENCOUNTER — Other Ambulatory Visit: Payer: Self-pay | Admitting: Family Medicine

## 2022-02-20 DIAGNOSIS — E039 Hypothyroidism, unspecified: Secondary | ICD-10-CM

## 2022-02-20 MED ORDER — LEVOTHYROXINE SODIUM 25 MCG PO TABS: 25.0000 ug | ORAL_TABLET | Freq: Every day | ORAL | 3 refills | Status: DC

## 2022-02-21 ENCOUNTER — Other Ambulatory Visit: Payer: Self-pay | Admitting: Family Medicine

## 2022-02-21 DIAGNOSIS — A749 Chlamydial infection, unspecified: Secondary | ICD-10-CM

## 2022-02-21 LAB — URINE CYTOLOGY ANCILLARY ONLY
Chlamydia: POSITIVE — AB
Comment: NEGATIVE
Comment: NEGATIVE
Comment: NORMAL
Neisseria Gonorrhea: NEGATIVE
Trichomonas: NEGATIVE

## 2022-02-21 MED ORDER — DOXYCYCLINE HYCLATE 100 MG PO TABS: 100.0000 mg | ORAL_TABLET | Freq: Two times a day (BID) | ORAL | 0 refills | Status: AC

## 2022-03-12 ENCOUNTER — Telehealth: Payer: 59 | Admitting: Family

## 2022-03-12 DIAGNOSIS — R300 Dysuria: Secondary | ICD-10-CM

## 2022-03-12 NOTE — Progress Notes (Signed)
Based on what you shared with me, I feel your condition warrants further evaluation and I recommend that you be seen in a face to face visit.  Given you were recently treated for Chlamydia and now having UTI symptoms, you need to be seen in person to have urine test completed.    NOTE: There will be NO CHARGE for this eVisit   If you are having a true medical emergency please call 911.      For an urgent face to face visit, [place] has six urgent care centers for your convenience:     [facility] Health Urgent Care Center at [facility] Directions [phone] [address] [place], [place] [address]    [facility] Health Urgent Care Center [facility]) Get Driving Directions [phone] [address] [place], [place] [address]  [facility] Health Urgent Care Center [facility]) Get Driving Directions [phone] [address] [place],  [place]  [address]  [facility] Health Urgent Care at [facility] Get Driving Directions [phone] [address] [place], [place] [address]   [facility] Health Urgent Care at [facility] Get Driving Directions  [phone] [address] [place], [place] [address]   [facility] Health Urgent Care at [facility] Directions [phone] [address] [place], [place] [address]  Your MyChart E-visit questionnaire answers were reviewed by a board certified advanced clinical practitioner to complete your personal care plan based on your specific symptoms.  Thank you for using e-Visits.

## 2022-03-28 ENCOUNTER — Encounter: Payer: Self-pay | Admitting: Family Medicine

## 2022-04-12 ENCOUNTER — Ambulatory Visit (INDEPENDENT_AMBULATORY_CARE_PROVIDER_SITE_OTHER): Payer: 59 | Admitting: Family Medicine

## 2022-04-12 ENCOUNTER — Encounter: Payer: Self-pay | Admitting: Family Medicine

## 2022-04-12 VITALS — BP 106/56 | HR 70 | Temp 97.8°F | Ht 62.0 in | Wt 239.4 lb

## 2022-04-12 DIAGNOSIS — F339 Major depressive disorder, recurrent, unspecified: Secondary | ICD-10-CM | POA: Diagnosis not present

## 2022-04-12 DIAGNOSIS — F419 Anxiety disorder, unspecified: Secondary | ICD-10-CM

## 2022-04-12 DIAGNOSIS — W19XXXA Unspecified fall, initial encounter: Secondary | ICD-10-CM | POA: Diagnosis not present

## 2022-04-12 DIAGNOSIS — M79642 Pain in left hand: Secondary | ICD-10-CM

## 2022-04-12 MED ORDER — QUETIAPINE FUMARATE 50 MG PO TABS: 50.0000 mg | ORAL_TABLET | Freq: Every day | ORAL | 1 refills | Status: DC

## 2022-04-12 NOTE — Progress Notes (Signed)
Established Patient Office Visit  Subjective   Patient ID: Amber Rivera, female    DOB: 2001-07-05  Age: 21 y.o. MRN: [id number]  Chief Complaint  Patient presents with   Depression   Anxiety   HPI Amber Rivera is here for a follow up of anxiety and depression. She has been managed by psychiatry through talkspace. She was started on seroquel about 1 month ago. She had been on ability 10 mg daily for a few months and they decreased this down to 5 mg daily at the same time due to increased irritability. She reports improvement with this regimen. She continues to have thoughts that she would be better off dead of times but denies any plan or intent to harm herself or others. She is about to run out of her Seroquel and does not have refills available. She has contacted her psychiatrist through talk space but the provider is out on vacation so she is unable to get a refill through this avenue.   She also reports a fall yesterday. She tripped in her basement and landed on her left hand on the concrete. There is some mild erythema. It is tender at the base of her pink. She denies numbness, tingling, wound, or decreased ROM.      04/12/2022   11:00 AM 01/20/2021   11:23 AM  Depression screen PHQ 2/9  Decreased Interest 2   Down, Depressed, Hopeless 2   PHQ - 2 Score 4   Altered sleeping 2   Tired, decreased energy 3   Change in appetite 3   Feeling bad or failure about yourself  2   Trouble concentrating 2   Moving slowly or fidgety/restless 2   Suicidal thoughts 2   PHQ-9 Score 20   Difficult doing work/chores Very difficult      Information is confidential and restricted. Go to Review Flowsheets to unlock data.      04/12/2022   11:01 AM  GAD 7 : Generalized Anxiety Score  Nervous, Anxious, on Edge 2  Control/stop worrying 2  Worry too much - different things 2  Trouble relaxing 2  Restless 1  Easily annoyed or irritable 3  Afraid - awful might happen 1  Total GAD 7 Score 13   Anxiety Difficulty Very difficult    Past Medical History:  Diagnosis Date   Anxiety    Depression    Thyroid disease       Review of Systems  Psychiatric/Behavioral:  Positive for depression.       Objective:     BP (!) 106/56   Pulse 70   Temp 97.8 F (36.6 C) (Temporal)   Ht 5\' 2"  (1.575 m)   Wt 239 lb 6 oz (108.6 kg)   SpO2 96%   BMI 43.78 kg/m    Physical Exam Vitals and nursing note reviewed.  Constitutional:      General: She is not in acute distress.    Appearance: She is not ill-appearing, toxic-appearing or diaphoretic.  Pulmonary:     Effort: Pulmonary effort is normal. No respiratory distress.  Musculoskeletal:     Left hand: Tenderness (Between 4th and 5th fingers) present. No swelling, deformity or bony tenderness. Normal range of motion. Normal strength.  Skin:    General: Skin is warm and dry.  Neurological:     General: No focal deficit present.     Mental Status: She is alert.  Psychiatric:        Mood and Affect: Mood normal.  Behavior: Behavior normal.        Thought Content: Thought content normal.        Judgment: Judgment normal.      No results found for any visits on 04/12/22.    The ASCVD Risk score (Arnett DK, et al., 2019) failed to calculate for the following reasons:   The 2019 ASCVD risk score is only valid for ages 14 to 54    Assessment & Plan:   Ndea was seen today for depression and anxiety.  Diagnoses and all orders for this visit:  Anxiety Depression, recurrent (HCC) Uncontrolled. Managed by psychiatry but provider is currently unavailable. Denies suicidal plan or intent. Given contact information for local psychiatry office where referral has been authorized. Also given info for [facility] with [facility]. Discussed will need to have psychiatry manage given her history of bipolar disorder, however I will fill in the gaps as needed. Encouraged her to reach out to me anytime.  -     QUEtiapine (SEROQUEL) 50 MG  tablet; Take 1 tablet (50 mg total) by mouth at bedtime.  Fall, initial encounter Left hand pain Discussed RICE therapy. Discussed if symptoms persist or worsen, will order an xray.    Return in about 3 months (around 07/13/2022) for chronic follow up. Return to office for new or worsening symptoms, or if symptoms persist.   The patient indicates understanding of these issues and agrees with the plan.   Gabriel Earing, FNP

## 2022-04-12 NOTE — Patient Instructions (Addendum)
New Behavioral Health Urgent Care for [facility] Residents For 24/7 walk-up access to mental health services for [facility] children (4+), adolescents and adults, please visit the new [facility] located at [address] in [place], [place] as well as [address] [place], [place] [address] HelpLine: [phone] or [phone]  [facility] [address] [place], [place] Amber Rivera [phone]   Managing Anxiety, Adult After being diagnosed with anxiety, you may be relieved to know why you have felt or behaved a certain way. You may also feel overwhelmed about the treatment ahead and what it will mean for your life. With care and support, you can manage this condition. How to manage lifestyle changes Managing stress and anxiety  Stress is your body's reaction to life changes and events, both good and bad. Most stress will last just a few hours, but stress can be ongoing and can lead to more than just stress. Although stress can play a major role in anxiety, it is not the same as anxiety. Stress is usually caused by something external, such as a deadline, test, or competition. Stress normally passes after the triggering event has ended.  Anxiety is caused by something internal, such as imagining a terrible outcome or worrying that something will go wrong that will devastate you. Anxiety often does not go away even after the triggering event is over, and it can become long-term (chronic) worry. It is important to understand the differences between stress and anxiety and to manage your stress effectively so that it does not lead to an anxious response. Talk with your health care provider or a counselor to learn more about reducing anxiety and stress. He or she may suggest tension reduction techniques, such as: Music therapy. Spend time creating or listening to music that you enjoy and that inspires you. Mindfulness-based  meditation. Practice being aware of your normal breaths while not trying to control your breathing. It can be done while sitting or walking. Centering prayer. This involves focusing on a word, phrase, or sacred image that means something to you and brings you peace. Deep breathing. To do this, expand your stomach and inhale slowly through your nose. Hold your breath for 3-5 seconds. Then exhale slowly, letting your stomach muscles relax. Self-talk. Learn to notice and identify thought patterns that lead to anxiety reactions and change those patterns to thoughts that feel peaceful. Muscle relaxation. Taking time to tense muscles and then relax them. Choose a tension reduction technique that fits your lifestyle and personality. These techniques take time and practice. Set aside 5-15 minutes a day to do them. Therapists can offer counseling and training in these techniques. The training to help with anxiety may be covered by some insurance plans. Other things you can do to manage stress and anxiety include: Keeping a stress diary. This can help you learn what triggers your reaction and then learn ways to manage your response. Thinking about how you react to certain situations. You may not be able to control everything, but you can control your response. Making time for activities that help you relax and not feeling guilty about spending your time in this way. Doing visual imagery. This involves imagining or creating mental pictures to help you relax. Practicing yoga. Through yoga poses, you can lower tension and promote relaxation.  Medicines Medicines can help ease symptoms. Medicines for anxiety include: Antidepressant medicines. These are usually prescribed for long-term daily control. Anti-anxiety medicines. These may be  added in severe cases, especially when panic attacks occur. Medicines will be prescribed by a health care provider. When used together, medicines, psychotherapy, and tension  reduction techniques may be the most effective treatment. Relationships Relationships can play a big part in helping you recover. Try to spend more time connecting with trusted friends and family members. Consider going to couples counseling if you have a partner, taking family education classes, or going to family therapy. Therapy can help you and others better understand your condition. How to recognize changes in your anxiety Everyone responds differently to treatment for anxiety. Recovery from anxiety happens when symptoms decrease and stop interfering with your daily activities at home or work. This may mean that you will start to: Have better concentration and focus. Worry will interfere less in your daily thinking. Sleep better. Be less irritable. Have more energy. Have improved memory. It is also important to recognize when your condition is getting worse. Contact your health care provider if your symptoms interfere with home or work and you feel like your condition is not improving. Follow these instructions at home: Activity Exercise. Adults should do the following: Exercise for at least 150 minutes each week. The exercise should increase your heart rate and make you sweat (moderate-intensity exercise). Strengthening exercises at least twice a week. Get the right amount and quality of sleep. Most adults need 7-9 hours of sleep each night. Lifestyle  Eat a healthy diet that includes plenty of vegetables, fruits, whole grains, low-fat dairy products, and lean protein. Do not eat a lot of foods that are high in fats, added sugars, or salt (sodium). Make choices that simplify your life. Do not use any products that contain nicotine or tobacco. These products include cigarettes, chewing tobacco, and vaping devices, such as e-cigarettes. If you need help quitting, ask your health care provider. Avoid caffeine, alcohol, and certain over-the-counter cold medicines. These may make you feel  worse. Ask your pharmacist which medicines to avoid. General instructions Take over-the-counter and prescription medicines only as told by your health care provider. Keep all follow-up visits. This is important. Where to find support You can get help and support from these sources: Self-help groups. Online and [employer]. A trusted spiritual leader. Couples counseling. Family education classes. Family therapy. Where to find more information You may find that joining a support group helps you deal with your anxiety. The following sources can help you locate counselors or support groups near you: Mental Health America: www.mentalhealthamerica.net Anxiety and Depression Association of [place] (ADAA): ProgramCam.de [employer] on Mental Illness (NAMI): www.nami.org Contact a health care provider if: You have a hard time staying focused or finishing daily tasks. You spend many hours a day feeling worried about everyday life. You become exhausted by worry. You start to have headaches or frequently feel tense. You develop chronic nausea or diarrhea. Get help right away if: You have a racing heart and shortness of breath. You have thoughts of hurting yourself or others. If you ever feel like you may hurt yourself or others, or have thoughts about taking your own life, get help right away. Go to your nearest emergency department or: Call your local emergency services (911 in the [place] Call a suicide crisis helpline, such as the National Suicide Prevention Lifeline at 858-618-7444 or 988 in the [place] This is open 24 hours a day in the [place] Text the Crisis Text Line at 906-691-4271 (in the [place].S.). Summary Taking steps to learn and use tension reduction techniques can  help calm you and help prevent triggering an anxiety reaction. When used together, medicines, psychotherapy, and tension reduction techniques may be the most effective treatment. Family, friends, and partners can play  a big part in supporting you. This information is not intended to replace advice given to you by your health care provider. Make sure you discuss any questions you have with your health care provider. Document Revised: 05/04/2021 Document Reviewed: 01/30/2021 Elsevier Patient Education  2023 [employer].

## 2022-05-10 ENCOUNTER — Encounter: Payer: Self-pay | Admitting: Family Medicine

## 2022-05-10 ENCOUNTER — Ambulatory Visit (INDEPENDENT_AMBULATORY_CARE_PROVIDER_SITE_OTHER): Payer: 59 | Admitting: Family Medicine

## 2022-05-10 ENCOUNTER — Ambulatory Visit (INDEPENDENT_AMBULATORY_CARE_PROVIDER_SITE_OTHER): Payer: 59

## 2022-05-10 VITALS — BP 102/54 | HR 70 | Temp 97.2°F | Ht 62.0 in | Wt 235.5 lb

## 2022-05-10 DIAGNOSIS — Z3009 Encounter for other general counseling and advice on contraception: Secondary | ICD-10-CM | POA: Diagnosis not present

## 2022-05-10 DIAGNOSIS — F419 Anxiety disorder, unspecified: Secondary | ICD-10-CM

## 2022-05-10 DIAGNOSIS — M79672 Pain in left foot: Secondary | ICD-10-CM | POA: Diagnosis not present

## 2022-05-10 DIAGNOSIS — E039 Hypothyroidism, unspecified: Secondary | ICD-10-CM | POA: Diagnosis not present

## 2022-05-10 DIAGNOSIS — F339 Major depressive disorder, recurrent, unspecified: Secondary | ICD-10-CM

## 2022-05-10 MED ORDER — MELOXICAM 15 MG PO TABS: 15.0000 mg | ORAL_TABLET | Freq: Every day | ORAL | 0 refills | Status: DC
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
## 2022-05-11 LAB — TSH: TSH: 6.26 u[IU]/mL — ABNORMAL HIGH (ref 0.450–4.500)

## 2022-05-12 ENCOUNTER — Other Ambulatory Visit: Payer: Self-pay | Admitting: Family Medicine

## 2022-05-12 DIAGNOSIS — E039 Hypothyroidism, unspecified: Secondary | ICD-10-CM

## 2022-05-12 MED ORDER — LEVOTHYROXINE SODIUM 50 MCG PO TABS: 50.0000 ug | ORAL_TABLET | Freq: Every day | ORAL | 1 refills | Status: DC

## 2022-05-24 ENCOUNTER — Encounter: Payer: 59 | Admitting: Family Medicine

## 2022-06-05 ENCOUNTER — Ambulatory Visit: Payer: 59 | Admitting: Advanced Practice Midwife

## 2022-06-06 ENCOUNTER — Other Ambulatory Visit: Payer: Self-pay | Admitting: Family Medicine

## 2022-06-06 DIAGNOSIS — M79672 Pain in left foot: Secondary | ICD-10-CM

## 2022-07-14 ENCOUNTER — Ambulatory Visit (INDEPENDENT_AMBULATORY_CARE_PROVIDER_SITE_OTHER): Payer: 59 | Admitting: Family Medicine

## 2022-07-14 ENCOUNTER — Encounter: Payer: Self-pay | Admitting: Family Medicine

## 2022-07-14 VITALS — BP 106/61 | HR 81 | Temp 97.9°F | Ht 62.0 in | Wt 232.4 lb

## 2022-07-14 DIAGNOSIS — F419 Anxiety disorder, unspecified: Secondary | ICD-10-CM

## 2022-07-14 DIAGNOSIS — Z23 Encounter for immunization: Secondary | ICD-10-CM | POA: Diagnosis not present

## 2022-07-14 DIAGNOSIS — E039 Hypothyroidism, unspecified: Secondary | ICD-10-CM | POA: Diagnosis not present

## 2022-07-14 DIAGNOSIS — F339 Major depressive disorder, recurrent, unspecified: Secondary | ICD-10-CM

## 2022-07-14 DIAGNOSIS — K644 Residual hemorrhoidal skin tags: Secondary | ICD-10-CM | POA: Diagnosis not present

## 2022-07-14 NOTE — Progress Notes (Signed)
   Established Patient Office Visit  Subjective   Patient ID: Amber Rivera, female    DOB: Jun 03, 2001  Age: 21 y.o. MRN: [id number]  Chief Complaint  Patient presents with  . Medical Management of Chronic Issues  . Hypothyroidism  . Anxiety  . Hemorrhoids    HPI  She stopped latuda and buspar and feels better since coming off this.   Will be starting prozac. She has been taking hydrozine prn.   She reports a hemorrhoid for about 1 weeks. She had a episode of vomiting of diarrhea prior. She reports some generalize discomfort and itching. She has not tried any remedies. She denies constipation or bleeding, or blood in stool.      07/14/2022   11:11 AM 05/10/2022   10:16 AM 04/12/2022   11:00 AM  Depression screen PHQ 2/9  Decreased Interest 2 2 2   Down, Depressed, Hopeless 2 3 2   PHQ - 2 Score 4 5 4   Altered sleeping 2 2 2   Tired, decreased energy 2 2 3   Change in appetite 3 3 3   Feeling bad or failure about yourself  2 3 2   Trouble concentrating 2 2 2   Moving slowly or fidgety/restless 1 1 2   Suicidal thoughts 1 1 2   PHQ-9 Score 17 19 20   Difficult doing work/chores Very difficult Very difficult Very difficult      07/14/2022   11:11 AM 05/10/2022   10:17 AM 04/12/2022   11:01 AM  GAD 7 : Generalized Anxiety Score  Nervous, Anxious, on Edge 3 2 2   Control/stop worrying 3 2 2   Worry too much - different things 3 2 2   Trouble relaxing 3 2 2   Restless 2 1 1   Easily annoyed or irritable 2 3 3   Afraid - awful might happen 2 2 1   Total GAD 7 Score 18 14 13   Anxiety Difficulty Very difficult Very difficult Very difficult     {History (Optional):23778}  ROS    Objective:     BP 106/61   Pulse 81   Temp 97.9 F (36.6 C) (Temporal)   Ht 5\' 2"  (1.575 m)   Wt 232 lb 6 oz (105.4 kg)   SpO2 97%   BMI 42.50 kg/m  {Vitals History (Optional):23777}  Physical Exam   No results found for any visits on 07/14/22.  {Labs (Optional):23779}  The ASCVD Risk score  (Arnett DK, et al., 2019) failed to calculate for the following reasons:   The 2019 ASCVD risk score is only valid for ages 25 to 20    Assessment & Plan:   Problem List Items Addressed This Visit   None Visit Diagnoses     Need for immunization against influenza    -  Primary   Relevant Orders   Flu Vaccine QUAD 45mo+IM (Fluarix, Fluzone & Alfiuria Quad PF) (Completed)       No follow-ups on file.    Gwenlyn Perking, FNP

## 2022-07-14 NOTE — Patient Instructions (Addendum)

## 2022-07-15 LAB — CMP14+EGFR
ALT: 9 IU/L (ref 0–32)
AST: 11 IU/L (ref 0–40)
Albumin/Globulin Ratio: 1.6 (ref 1.2–2.2)
Albumin: 4.3 g/dL (ref 4.0–5.0)
Alkaline Phosphatase: 49 IU/L (ref 44–121)
BUN/Creatinine Ratio: 10 (ref 9–23)
BUN: 6 mg/dL (ref 6–20)
Bilirubin Total: 0.2 mg/dL (ref 0.0–1.2)
CO2: 23 mmol/L (ref 20–29)
Calcium: 9.4 mg/dL (ref 8.7–10.2)
Chloride: 104 mmol/L (ref 96–106)
Creatinine, Ser: 0.63 mg/dL (ref 0.57–1.00)
Globulin, Total: 2.7 g/dL (ref 1.5–4.5)
Glucose: 92 mg/dL (ref 70–99)
Potassium: 4.7 mmol/L (ref 3.5–5.2)
Sodium: 139 mmol/L (ref 134–144)
Total Protein: 7 g/dL (ref 6.0–8.5)
eGFR: 128 mL/min/{1.73_m2} (ref >59)

## 2022-07-15 LAB — CBC WITH DIFFERENTIAL/PLATELET
Basophils Absolute: 0 10*3/uL (ref 0.0–0.2)
Basos: 0 %
EOS (ABSOLUTE): 0.2 10*3/uL (ref 0.0–0.4)
Eos: 3 %
Hematocrit: 36.5 % (ref 34.0–46.6)
Hemoglobin: 11.9 g/dL (ref 11.1–15.9)
Immature Grans (Abs): 0 10*3/uL (ref 0.0–0.1)
Immature Granulocytes: 0 %
Lymphocytes Absolute: 1.6 10*3/uL (ref 0.7–3.1)
Lymphs: 21 %
MCH: 28.7 pg (ref 26.6–33.0)
MCHC: 32.6 g/dL (ref 31.5–35.7)
MCV: 88 fL (ref 79–97)
Monocytes Absolute: 0.4 10*3/uL (ref 0.1–0.9)
Monocytes: 5 %
Neutrophils Absolute: 5.4 10*3/uL (ref 1.4–7.0)
Neutrophils: 71 %
Platelets: 267 10*3/uL (ref 150–450)
RBC: 4.15 x10E6/uL (ref 3.77–5.28)
RDW: 12.4 % (ref 11.7–15.4)
WBC: 7.6 10*3/uL (ref 3.4–10.8)

## 2022-07-15 LAB — TSH: TSH: 5.6 u[IU]/mL — ABNORMAL HIGH (ref 0.450–4.500)

## 2022-07-17 ENCOUNTER — Other Ambulatory Visit: Payer: Self-pay | Admitting: Family Medicine

## 2022-07-17 DIAGNOSIS — E039 Hypothyroidism, unspecified: Secondary | ICD-10-CM

## 2022-07-17 MED ORDER — LEVOTHYROXINE SODIUM 75 MCG PO TABS: 75.0000 ug | ORAL_TABLET | Freq: Every day | ORAL | 1 refills | Status: DC

## 2022-09-04 ENCOUNTER — Encounter: Payer: Self-pay | Admitting: Nurse Practitioner

## 2022-09-05 ENCOUNTER — Encounter: Payer: Self-pay | Admitting: Nurse Practitioner

## 2022-09-05 ENCOUNTER — Other Ambulatory Visit (HOSPITAL_COMMUNITY)
Admission: RE | Admit: 2022-09-05 | Discharge: 2022-09-05 | Disposition: A | Discharge status: Home / Self Care | Payer: 59 | Source: Ambulatory Visit | Attending: Nurse Practitioner | Admitting: Nurse Practitioner

## 2022-09-05 ENCOUNTER — Ambulatory Visit (INDEPENDENT_AMBULATORY_CARE_PROVIDER_SITE_OTHER): Payer: 59 | Admitting: Nurse Practitioner

## 2022-09-05 VITALS — BP 138/82 | HR 88 | Resp 12 | Ht 62.0 in | Wt 238.0 lb

## 2022-09-05 DIAGNOSIS — N921 Excessive and frequent menstruation with irregular cycle: Secondary | ICD-10-CM

## 2022-09-05 DIAGNOSIS — Z113 Encounter for screening for infections with a predominantly sexual mode of transmission: Secondary | ICD-10-CM

## 2022-09-05 DIAGNOSIS — N898 Other specified noninflammatory disorders of vagina: Secondary | ICD-10-CM

## 2022-09-05 DIAGNOSIS — N924 Excessive bleeding in the premenopausal period: Secondary | ICD-10-CM

## 2022-09-05 DIAGNOSIS — N930 Postcoital and contact bleeding: Secondary | ICD-10-CM

## 2022-09-05 DIAGNOSIS — Z01419 Encounter for gynecological examination (general) (routine) without abnormal findings: Secondary | ICD-10-CM | POA: Diagnosis present

## 2022-09-05 DIAGNOSIS — N760 Acute vaginitis: Secondary | ICD-10-CM | POA: Diagnosis not present

## 2022-09-05 DIAGNOSIS — B9689 Other specified bacterial agents as the cause of diseases classified elsewhere: Secondary | ICD-10-CM

## 2022-09-05 LAB — WET PREP FOR TRICH, YEAST, CLUE

## 2022-09-05 MED ORDER — METRONIDAZOLE 0.75 % VA GEL: 1.0000 | Freq: Every day | VAGINAL | 0 refills | Status: AC

## 2022-09-05 NOTE — Progress Notes (Signed)
Amber Rivera Jan 10, 2001 [id number]   History:  21 y.o. G0 presents as new patient to establish care. Monthly cycles but has been experiencing mid cycle spotting for about 6 months. She also has occasional spotting with intercourse. Denies itching, discharge or odor. Treated for Chlamydia in April. Has not had pap smear. Hypothyroidism managed by PCP, MDD, GAD managed by psychiatry. Gardasil series completed.   Gynecologic History Patient's last menstrual period was 08/16/2022. Period Cycle (Days): 28 Period Duration (Days): 5-6 Period Pattern: Regular Menstrual Flow: Moderate Menstrual Control: Tampon Menstrual Control Change Freq (Hours): changes tampon 4-5x day Dysmenorrhea: (!) Severe Dysmenorrhea Symptoms: Diarrhea, Other (Comment) (back pain) Contraception/Family planning: OCP (estrogen/progesterone) Sexually active: Yes  Health Maintenance Last Pap: Never Last mammogram: Not indicated  Last colonoscopy: Not indicated  Last Dexa: Not indicated   Past medical history, past surgical history, family history and social history were all reviewed and documented in the EPIC chart. Living at home. Has brother and sister.   ROS:  A ROS was performed and pertinent positives and negatives are included.  Exam:  Vitals:   09/05/22 1045  BP: 138/82  Pulse: 88  Resp: 12  Weight: 238 lb (108 kg)  Height: 5\' 2"  (1.575 m)   Body mass index is 43.53 kg/m.  General appearance:  Normal Thyroid:  Symmetrical, normal in size, without palpable masses or nodularity. Respiratory  Auscultation:  Clear without wheezing or rhonchi Cardiovascular  Auscultation:  Regular rate, without rubs, murmurs or gallops  Edema/varicosities:  Not grossly evident Abdominal  Soft,nontender, without masses, guarding or rebound.  Liver/spleen:  No organomegaly noted  Hernia:  None appreciated  Skin  Inspection:  Grossly normal Breasts: Not indicated per guidelines Genitourinary   Inguinal/mons:   Normal without inguinal adenopathy  External genitalia:  Normal appearing vulva with no masses, tenderness, or lesions  BUS/Urethra/Skene's glands:  Normal  Vagina:  Discharge, mild erythema  Cervix:  Normal appearing without discharge or lesions  Uterus:  Normal in size, shape and contour.  Midline and mobile, nontender  Adnexa/parametria:     Rt: Normal in size, without masses or tenderness.   Lt: Normal in size, without masses or tenderness.  Anus and perineum: Normal  Digital rectal exam: Not indicated  Patient informed chaperone available to be present for breast and pelvic exam. Patient has requested no chaperone to be present. Patient has been advised what will be completed during breast and pelvic exam.   Assessment/Plan:  21 y.o. G0 to establish care.   Well female exam with routine gynecological exam - Plan: Cytology - PAP( [place]). Education provided on SBEs, importance of preventative screenings, current guidelines, high calcium diet, regular exercise, and multivitamin daily.  Labs with PCP.   Screening examination for STD (sexually transmitted disease) - Plan: Cytology - PAP( [place]), WET PREP FOR TRICH, YEAST, CLUE, RPR, HIV Antibody (routine testing w rflx). GC/CT added to pap.   Breakthrough bleeding on birth control pills - Plan: WET PREP FOR TRICH, YEAST, CLUE. + clue cells - could be causing BTB and postcoital bleeding. STD panel pending. If no improvement after treatment we discussed option to switch COCs and she is agreeable.   Premenopausal postcoital bleeding - Plan: Cytology - PAP( [place]), WET PREP FOR TRICH, YEAST, CLUE. BV could be cause for bleeding. Will monitor. STD panel pending.   Vaginal discharge - Plan: WET PREP FOR TRICH, YEAST, CLUE. + clue cells.   Bacterial vaginosis - Plan: metroNIDAZOLE (METROGEL) 0.75 % vaginal  gel nightly x 5 nights.  Screening for cervical cancer - Initial pap today.   Return in 1 year for annual.      Olivia Mackie DNP, 11:02 AM 09/05/2022

## 2022-09-06 LAB — RPR: RPR Ser Ql: NONREACTIVE

## 2022-09-06 LAB — HIV ANTIBODY (ROUTINE TESTING W REFLEX): HIV 1&2 Ab, 4th Generation: NONREACTIVE

## 2022-09-08 LAB — CYTOLOGY - PAP
Chlamydia: NEGATIVE
Comment: NEGATIVE
Comment: NORMAL
Neisseria Gonorrhea: NEGATIVE

## 2022-09-24 ENCOUNTER — Encounter: Payer: Self-pay | Admitting: Nurse Practitioner

## 2022-09-25 ENCOUNTER — Other Ambulatory Visit: Payer: Self-pay | Admitting: Nurse Practitioner

## 2022-09-25 DIAGNOSIS — Z3041 Encounter for surveillance of contraceptive pills: Secondary | ICD-10-CM

## 2022-09-25 MED ORDER — NORETHINDRONE ACET-ETHINYL EST 1.5-30 MG-MCG PO TABS: 1.0000 | ORAL_TABLET | Freq: Every day | ORAL | 1 refills | Status: DC

## 2022-10-13 ENCOUNTER — Encounter: Payer: Self-pay | Admitting: Family Medicine

## 2022-10-13 ENCOUNTER — Ambulatory Visit (INDEPENDENT_AMBULATORY_CARE_PROVIDER_SITE_OTHER): Payer: 59 | Admitting: Family Medicine

## 2022-10-13 VITALS — BP 120/71 | HR 80 | Temp 98.5°F | Ht 62.0 in | Wt 225.0 lb

## 2022-10-13 DIAGNOSIS — E039 Hypothyroidism, unspecified: Secondary | ICD-10-CM | POA: Diagnosis not present

## 2022-10-13 DIAGNOSIS — F419 Anxiety disorder, unspecified: Secondary | ICD-10-CM

## 2022-10-13 DIAGNOSIS — F339 Major depressive disorder, recurrent, unspecified: Secondary | ICD-10-CM

## 2022-10-13 NOTE — Progress Notes (Signed)
Established Patient Office Visit  Subjective   Patient ID: Amber Rivera, female    DOB: 12/31/2000  Age: 21 y.o. MRN: [id number]  Chief Complaint  Patient presents with   Medical Management of Chronic Issues    3 month     HPI Thyroid Compliant with medications - Yes Current medications - levothyroxine 75 mcg  Weight -  stable Bowel habit changes - No Heat or cold intolerance - No Mood changes - No Changes in sleep habits - No Fatigue - No Skin, hair, or nail changes - No Tremor - No Palpitations - No Edema - No Shortness of breath - No Changes in menses - No  Lab Results  Component Value Date   TSH 5.600 (H) 07/14/2022   2. Anxiety/depression She is continuing to follow up with [facility]. She feels like her current medication regimen is really helping and she has noticed an improvement. Denies SI, plan, or intent.      10/13/2022    1:49 PM 07/14/2022   11:11 AM 05/10/2022   10:16 AM  Depression screen PHQ 2/9  Decreased Interest 1 2 2   Down, Depressed, Hopeless 2 2 3   PHQ - 2 Score 3 4 5   Altered sleeping 1 2 2   Tired, decreased energy 1 2 2   Change in appetite 1 3 3   Feeling bad or failure about yourself  1 2 3   Trouble concentrating 1 2 2   Moving slowly or fidgety/restless 1 1 1   Suicidal thoughts 1 1 1   PHQ-9 Score 10 17 19   Difficult doing work/chores Somewhat difficult Very difficult Very difficult      10/13/2022    1:50 PM 07/14/2022   11:11 AM 05/10/2022   10:17 AM 04/12/2022   11:01 AM  GAD 7 : Generalized Anxiety Score  Nervous, Anxious, on Edge 2 3 2 2   Control/stop worrying 2 3 2 2   Worry too much - different things 3 3 2 2   Trouble relaxing 3 3 2 2   Restless 2 2 1 1   Easily annoyed or irritable 2 2 3 3   Afraid - awful might happen 2 2 2 1   Total GAD 7 Score 16 18 14 13   Anxiety Difficulty  Very difficult Very difficult Very difficult     Past Medical History:  Diagnosis Date   Anxiety    Depression    Thyroid disease        ROS As per HPI.    Objective:     BP 120/71   Pulse 80   Temp 98.5 F (36.9 C)   Ht 5\' 2"  (1.575 m)   Wt 225 lb (102.1 kg)   LMP 09/13/2022   SpO2 97%   BMI 41.15 kg/m  Wt Readings from Last 3 Encounters:  10/13/22 225 lb (102.1 kg)  09/05/22 238 lb (108 kg)  07/14/22 232 lb 6 oz (105.4 kg)      Physical Exam Vitals and nursing note reviewed.  Constitutional:      General: She is not in acute distress.    Appearance: She is not ill-appearing, toxic-appearing or diaphoretic.  Neck:     Thyroid: No thyroid mass, thyromegaly or thyroid tenderness.  Cardiovascular:     Rate and Rhythm: Normal rate and regular rhythm.     Heart sounds: Normal heart sounds. No murmur heard. Pulmonary:     Effort: Pulmonary effort is normal. No respiratory distress.     Breath sounds: Normal breath sounds. No wheezing.  Musculoskeletal:  Cervical back: Neck supple. No rigidity.     Right lower leg: No edema.     Left lower leg: No edema.  Skin:    General: Skin is warm and dry.  Neurological:     General: No focal deficit present.     Mental Status: She is alert and oriented to person, place, and time.  Psychiatric:        Mood and Affect: Mood normal.        Behavior: Behavior normal.      No results found for any visits on 10/13/22.    The ASCVD Risk score (Arnett DK, et al., 2019) failed to calculate for the following reasons:   The 2019 ASCVD risk score is only valid for ages 45 to 29    Assessment & Plan:   Amber Rivera was seen today for medical management of chronic issues.  Diagnoses and all orders for this visit:  Acquired hypothyroidism On levothyroxine 75 mcg daily. Labs pending.  -     TSH -     T4, Free  Depression, recurrent (HCC) Anxiety Managed by psychiatry. Improving symptoms. Denies SI, plan, or intent.    Return in about 6 months (around 04/14/2023).   The patient indicates understanding of these issues and agrees with the plan.    Gabriel Earing, FNP

## 2022-10-14 LAB — T4, FREE: Free T4: 1.27 ng/dL (ref 0.82–1.77)

## 2022-10-14 LAB — TSH: TSH: 3.2 u[IU]/mL (ref 0.450–4.500)

## 2022-11-14 ENCOUNTER — Encounter: Payer: Self-pay | Admitting: Nurse Practitioner

## 2022-11-15 NOTE — Telephone Encounter (Signed)
Pt scheduled for 11/16/2022.  

## 2022-11-16 ENCOUNTER — Encounter: Payer: Self-pay | Admitting: Obstetrics & Gynecology

## 2022-11-16 ENCOUNTER — Ambulatory Visit (INDEPENDENT_AMBULATORY_CARE_PROVIDER_SITE_OTHER): Payer: 59 | Admitting: Obstetrics & Gynecology

## 2022-11-16 VITALS — BP 120/80 | HR 99 | Temp 99.0°F

## 2022-11-16 DIAGNOSIS — R350 Frequency of micturition: Secondary | ICD-10-CM | POA: Diagnosis not present

## 2022-11-16 DIAGNOSIS — N898 Other specified noninflammatory disorders of vagina: Secondary | ICD-10-CM | POA: Diagnosis not present

## 2022-11-16 LAB — WET PREP FOR TRICH, YEAST, CLUE

## 2022-11-16 MED ORDER — TINIDAZOLE 500 MG PO TABS: 1000.0000 mg | ORAL_TABLET | Freq: Two times a day (BID) | ORAL | 0 refills | Status: AC

## 2022-11-16 NOTE — Progress Notes (Signed)
    Amber Rivera 2001/07/12 [id number]        21 y.o.  G0  RP: Urinary frequency and vaginal odor.  HPI: Vaginal discharge with odor.  Last treated for BV in 09/2022 with Metrogel.  No pelvic pain.  No fever.  STI screen Neg 09/2022. Currently abstinent. Declines STI screen.  Pt c/o urinary frequency, poor bladder control.  Well on new BCP Junel 1.5/30 with less BTB x 09/2022.    OB History  Gravida Para Term Preterm AB Living  0 0 0 0 0 0  SAB IAB Ectopic Multiple Live Births  0 0 0 0 0    Past medical history,surgical history, problem list, medications, allergies, family history and social history were all reviewed and documented in the EPIC chart.   Directed ROS with pertinent positives and negatives documented in the history of present illness/assessment and plan.  Exam:  Vitals:   11/16/22 1019  BP: 120/80  Pulse: 99  Temp: 99 F (37.2 C)  TempSrc: Oral  SpO2: 99%   General appearance:  Normal  Abdomen: Normal  Gynecologic exam: Vulva normal.  Speculum:  Cervix/Vagina normal.  Increased discharge.  Wet prep done.  U/A: Yellow, cloudy, Pro Neg, Nit Neg, WBC 0-5, RBC Neg, Bacteria Few.  Few Clue cells present.  Pending U. Culture. Wet prep: Clue cells present with odor   Assessment/Plan:  22 y.o. G0P0000   1. Urinary frequency Pt c/o urinary frequency, poor bladder control.  U/A mildly abnormal with Clue cells.  Will wait on U. Culture. - Urinalysis,Complete w/RFL Culture  2. Vaginal odor Vaginal discharge with odor.  Last treated for BV in 09/2022 with Metrogel.  No pelvic pain.  No fever.  STI screen Neg 09/2022. Currently abstinent. Declines STI screen. Well on new BCP Junel 1.5/30 with less BTB x 09/2022.  Bacterial Vaginosis confirmed by Wet prep.  Will treat with Tinidazole.  Usage reviewed.  No alcohol while taking.  Prescription sent to pharmacy. - WET PREP FOR [place], YEAST, CLUE  Other orders - hydrOXYzine (ATARAX) 25 MG tablet; Take 25 mg by mouth  daily. - tinidazole (TINDAMAX) 500 MG tablet; Take 2 tablets (1,000 mg total) by mouth 2 (two) times daily for 2 days.   Princess Bruins MD, 10:34 AM 11/16/2022

## 2022-11-18 LAB — CULTURE INDICATED

## 2022-11-18 LAB — URINALYSIS, COMPLETE W/RFL CULTURE
Bilirubin Urine: NEGATIVE
Casts: NONE SEEN /LPF
Crystals: NONE SEEN /HPF
Glucose, UA: NEGATIVE
Hgb urine dipstick: NEGATIVE
Hyaline Cast: NONE SEEN /LPF
Ketones, ur: NEGATIVE
Nitrites, Initial: NEGATIVE
Protein, ur: NEGATIVE
RBC / HPF: NONE SEEN /HPF (ref 0–2)
Specific Gravity, Urine: 1.02 (ref 1.001–1.035)
Yeast: NONE SEEN /HPF
pH: 6.5 (ref 5.0–8.0)

## 2022-11-18 LAB — URINE CULTURE
MICRO NUMBER:: 14472658
SPECIMEN QUALITY:: ADEQUATE

## 2023-01-08 ENCOUNTER — Ambulatory Visit (INDEPENDENT_AMBULATORY_CARE_PROVIDER_SITE_OTHER): Payer: 59 | Admitting: Family Medicine

## 2023-01-08 ENCOUNTER — Encounter: Payer: Self-pay | Admitting: Family Medicine

## 2023-01-08 VITALS — BP 112/60 | HR 75 | Temp 97.6°F | Ht 62.0 in | Wt 227.0 lb

## 2023-01-08 DIAGNOSIS — M546 Pain in thoracic spine: Secondary | ICD-10-CM

## 2023-01-08 LAB — URINALYSIS, ROUTINE W REFLEX MICROSCOPIC
Bilirubin, UA: NEGATIVE
Glucose, UA: NEGATIVE
Ketones, UA: NEGATIVE
Nitrite, UA: NEGATIVE
Protein,UA: NEGATIVE
RBC, UA: NEGATIVE
Specific Gravity, UA: 1.02 (ref 1.005–1.030)
Urobilinogen, Ur: 0.2 mg/dL (ref 0.2–1.0)
pH, UA: 7 (ref 5.0–7.5)

## 2023-01-08 LAB — MICROSCOPIC EXAMINATION
RBC, Urine: NONE SEEN /hpf (ref 0–2)
Renal Epithel, UA: NONE SEEN /hpf

## 2023-01-08 MED ORDER — IBUPROFEN 800 MG PO TABS: 800.0000 mg | ORAL_TABLET | Freq: Three times a day (TID) | ORAL | 0 refills | Status: DC | PRN

## 2023-01-08 MED ORDER — CYCLOBENZAPRINE HCL 10 MG PO TABS: 10.0000 mg | ORAL_TABLET | Freq: Three times a day (TID) | ORAL | 0 refills | Status: DC | PRN

## 2023-01-08 NOTE — Progress Notes (Signed)
Acute Office Visit  Subjective:     Patient ID: Amber Rivera, female    DOB: 09/25/2001, 22 y.o.   MRN: [id number]  Chief Complaint  Patient presents with   Flank Pain    Flank Pain This is a new problem. Episode onset: 1 week. The problem occurs constantly. The problem has been gradually worsening since onset. The quality of the pain is described as aching and shooting (intermittent shooting pains with movement). The pain is at a severity of 6/10. The pain is Worse during the night. The symptoms are aggravated by coughing, standing and twisting. Pertinent negatives include no abdominal pain, bladder incontinence, bowel incontinence, chest pain, dysuria, fever, headaches, leg pain, numbness, paresis, paresthesias, pelvic pain, perianal numbness, tingling or weakness. (Hesitancy and urgency- reports chronic and stable) Risk factors include obesity. She has tried NSAIDs for the symptoms. The treatment provided no relief.     Review of Systems  Constitutional:  Negative for chills and fever.  Cardiovascular:  Negative for chest pain.  Gastrointestinal:  Negative for abdominal pain and bowel incontinence.  Genitourinary:  Positive for urgency. Negative for bladder incontinence, dysuria, flank pain, frequency, hematuria and pelvic pain.  Musculoskeletal:  Positive for back pain.  Neurological:  Negative for tingling, weakness, numbness, headaches and paresthesias.        Objective:    BP 112/60   Pulse 75   Temp 97.6 F (36.4 C) (Temporal)   Ht 5\' 2"  (1.575 m)   Wt 227 lb (103 kg)   SpO2 97%   BMI 41.52 kg/m    Physical Exam Vitals and nursing note reviewed.  Cardiovascular:     Rate and Rhythm: Normal rate and regular rhythm.     Heart sounds: Normal heart sounds. No murmur heard. Pulmonary:     Effort: Pulmonary effort is normal. No respiratory distress.     Breath sounds: Normal breath sounds.  Abdominal:     General: Bowel sounds are normal. There is no distension.      Palpations: Abdomen is soft.     Tenderness: There is no abdominal tenderness. There is no right CVA tenderness, left CVA tenderness, guarding or rebound.  Musculoskeletal:     Thoracic back: Spasms and tenderness (left) present. No swelling, edema, signs of trauma or bony tenderness. Normal range of motion.     Right lower leg: No edema.     Left lower leg: No edema.  Skin:    General: Skin is warm and dry.  Neurological:     General: No focal deficit present.     Mental Status: She is alert.  Psychiatric:        Mood and Affect: Mood normal.        Behavior: Behavior normal.    Urine dipstick shows positive for WBC's.  Micro exam: 0-5 WBC's per HPF, 0 RBC's per HPF, and few + bacteria.   No results found for any visits on 01/08/23.      Assessment & Plan:   Nimue was seen today for flank pain.  Diagnoses and all orders for this visit:  Acute left-sided thoracic back pain Discussed MSK in nature. No red flags. Flexeril prn for spasms. Advil as below. Negative UA today, culture is pending. Return to office for new or worsening symptoms, or if symptoms persist.  -     Urinalysis, Routine w reflex microscopic -     Urine Culture -     cyclobenzaprine (FLEXERIL) 10 MG tablet; Take 1 tablet (  10 mg total) by mouth 3 (three) times daily as needed for muscle spasms. -     ibuprofen (ADVIL) 800 MG tablet; Take 1 tablet (800 mg total) by mouth every 8 (eight) hours as needed.  The patient indicates understanding of these issues and agrees with the plan.   Gwenlyn Perking, FNP

## 2023-01-10 LAB — URINE CULTURE

## 2023-01-24 ENCOUNTER — Other Ambulatory Visit: Payer: Self-pay | Admitting: Family Medicine

## 2023-01-24 DIAGNOSIS — E039 Hypothyroidism, unspecified: Secondary | ICD-10-CM

## 2023-03-11 ENCOUNTER — Other Ambulatory Visit: Payer: Self-pay | Admitting: Nurse Practitioner

## 2023-03-11 DIAGNOSIS — Z3041 Encounter for surveillance of contraceptive pills: Secondary | ICD-10-CM

## 2023-03-12 NOTE — Telephone Encounter (Signed)
Med refill request: Junel Fe Last AEX: 09/05/22 Next AEX: not scheduled Last MMG (if hormonal med) Refill authorized: Please Advise?

## 2023-04-06 IMAGING — DX DG SACRUM/COCCYX 2+V
3 series · 3 of 3 positions shown · non-contrast
Comparison: None.

CLINICAL DATA: Sacral pain

EXAM:
SACRUM AND COCCYX - 2+ VIEW

[coccyx ap]
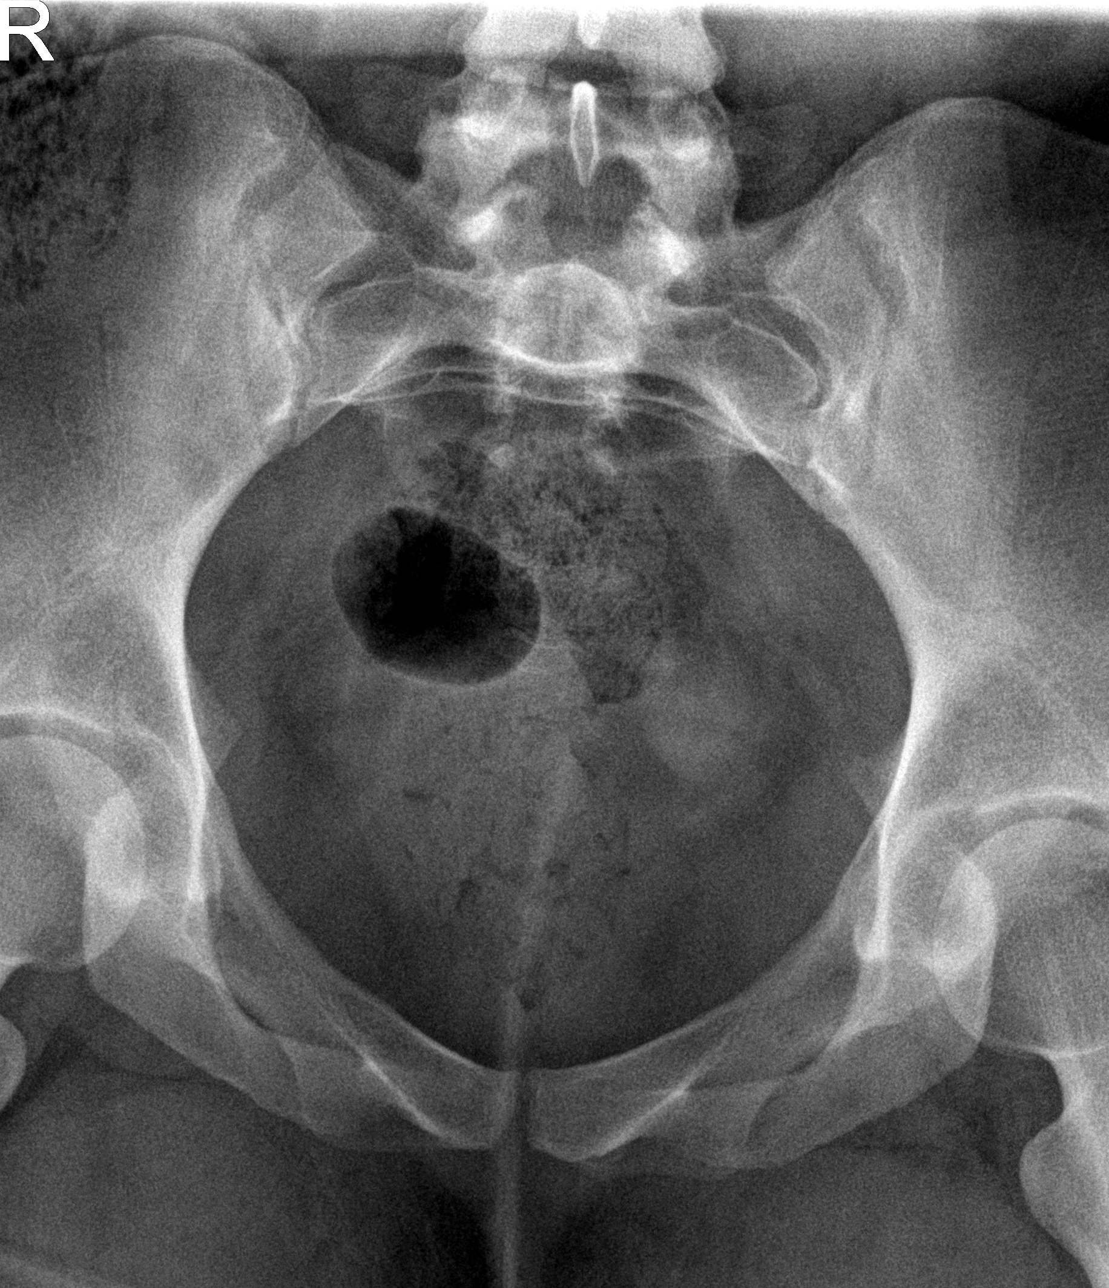

[sacrum ap]
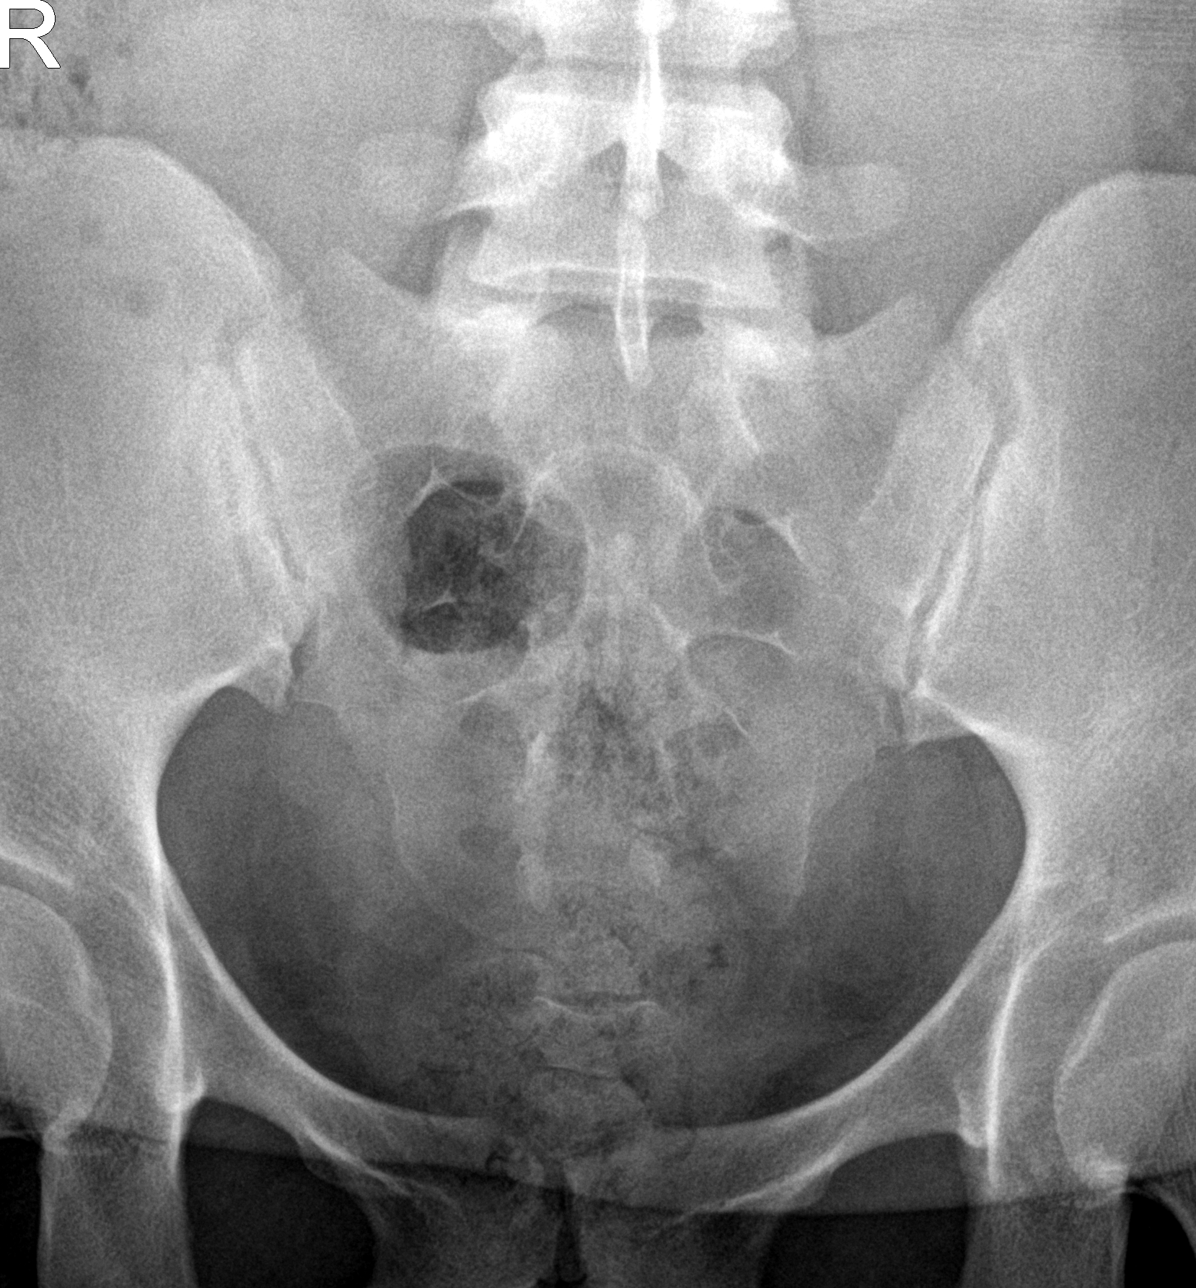

[sacrum lat]
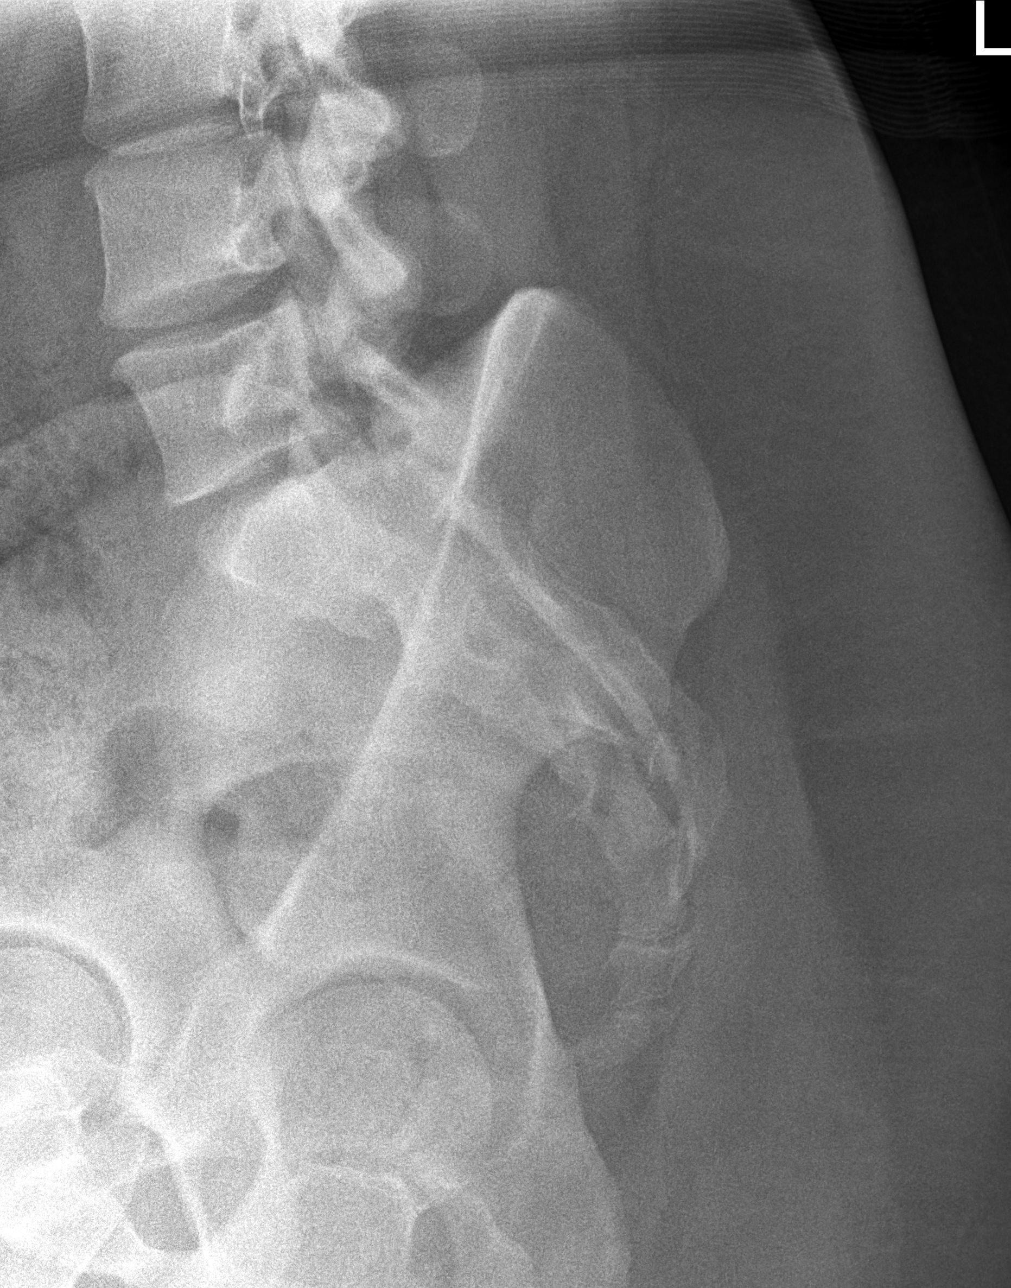

[3 of 3 positions shown; findings below may reference images not displayed]

FINDINGS: There is no evidence of fracture or other focal bone lesions.
Bilateral SI joints are intact without arthropathy or evidence of
sacroiliitis. No diastasis.
IMPRESSION: Negative.

## 2023-04-15 ENCOUNTER — Encounter: Payer: Self-pay | Admitting: Nurse Practitioner

## 2023-04-16 ENCOUNTER — Encounter: Payer: Self-pay | Admitting: Family Medicine

## 2023-04-18 ENCOUNTER — Other Ambulatory Visit: Payer: Self-pay | Admitting: Family Medicine

## 2023-04-18 DIAGNOSIS — F419 Anxiety disorder, unspecified: Secondary | ICD-10-CM

## 2023-04-18 DIAGNOSIS — F339 Major depressive disorder, recurrent, unspecified: Secondary | ICD-10-CM

## 2023-04-18 MED ORDER — QUETIAPINE FUMARATE 50 MG PO TABS: 50.0000 mg | ORAL_TABLET | Freq: Every day | ORAL | 0 refills | Status: DC

## 2023-04-18 MED ORDER — FLUOXETINE HCL 20 MG PO CAPS: 20.0000 mg | ORAL_CAPSULE | Freq: Every day | ORAL | 0 refills | Status: DC

## 2023-04-18 MED ORDER — OXCARBAZEPINE 150 MG PO TABS: 150.0000 mg | ORAL_TABLET | Freq: Two times a day (BID) | ORAL | 0 refills | Status: DC

## 2023-04-22 ENCOUNTER — Other Ambulatory Visit: Payer: Self-pay | Admitting: Nurse Practitioner

## 2023-04-22 DIAGNOSIS — Z30016 Encounter for initial prescription of transdermal patch hormonal contraceptive device: Secondary | ICD-10-CM

## 2023-04-22 MED ORDER — NORELGESTROMIN-ETH ESTRADIOL 150-35 MCG/24HR TD PTWK: 1.0000 | MEDICATED_PATCH | TRANSDERMAL | 1 refills | Status: DC

## 2023-05-07 ENCOUNTER — Ambulatory Visit (INDEPENDENT_AMBULATORY_CARE_PROVIDER_SITE_OTHER): Payer: 59

## 2023-05-07 ENCOUNTER — Encounter: Payer: Self-pay | Admitting: Podiatry

## 2023-05-07 ENCOUNTER — Ambulatory Visit: Payer: 59 | Admitting: Podiatry

## 2023-05-07 DIAGNOSIS — M21612 Bunion of left foot: Secondary | ICD-10-CM

## 2023-05-07 NOTE — Progress Notes (Signed)
  Subjective:  Patient ID: Amber Rivera, female    DOB: 2001-05-21,   MRN: [id number]  Chief Complaint  Patient presents with   Bunions    Left foot bunion causing discomfort     22 y.o. female presents for concern of left foot bunion that has been present for years. Relates she has had worsening pain in the last 6 months. She has tried different shoes and padding but has not had any relief. Relates she has tried arch supports as well without change. She has seen a podiatrist in the past and relates she is aware she may need surgery at this point. She works at [employer] and on her feet for long periods .Patient does vape and smokes weed.  Denies any other pedal complaints. Denies n/v/f/c.   Past Medical History:  Diagnosis Date   Anxiety    Depression    Thyroid disease     Objective:  Physical Exam: Vascular: DP/PT pulses 2/4 bilateral. CFT <3 seconds. Normal hair growth on digits. No edema.  Skin. No lacerations or abrasions bilateral feet.  Musculoskeletal: MMT 5/5 bilateral lower extremities in DF, PF, Inversion and Eversion. Deceased ROM in DF of ankle joint. 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 Tender to medial eminence. Some pain dorsally to the first MPJ. No pain with ROM of the first MPJ. No hypermobility noted Neurological: Sensation intact to light touch.   Assessment:   1. Bunion, left      Plan:  Patient was evaluated and treated and all questions answered. -Xrays reviewed. No acute fractures or dislocations noted. 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 No degenerative changes noted at the first MPJ.  -Discussed HAV and treatment options;conservative and surgical management; risks, benefits, alternatives discussed. All patient's questions answered. -Discussed padding and wide shoe gear.   -Recommend continue with good supportive shoes and inserts.  -Discussed surgical options. Patient appears to have exhausted conservative treatments  and is ready to discuss surgery. Discussed in detail austin bunionectomy and possible akin osteotomy. Discussed perioperative course and recovery.  -Informed surgical risk consent was reviewed and read aloud to the patient.  I reviewed the films.  I have discussed my findings with the patient in great detail.  I have discussed all risks including but not limited to infection, stiffness, scarring, limp, disability, deformity, damage to blood vessels and nerves, numbness, poor healing, need for braces, arthritis, chronic pain, amputation, death.  All benefits and realistic expectations discussed in great detail.  I have made no promises as to the outcome.  I have provided realistic expectations.  I have offered the patient a 2nd opinion, which they have declined and assured me they preferred to proceed despite the risks. Did discuss smoking cessation and refraining from nicotine for at least 4 weeks leading up to surgery and 4 weeks after. Discussed risks of smoking on healing process.  Patient expressed understanding.  Will plan for surgery towards the end of September.    Louann Sjogren, DPM

## 2023-05-22 ENCOUNTER — Other Ambulatory Visit: Payer: Self-pay | Admitting: Podiatry

## 2023-05-22 ENCOUNTER — Encounter: Payer: Self-pay | Admitting: Podiatry

## 2023-05-22 DIAGNOSIS — M21612 Bunion of left foot: Secondary | ICD-10-CM

## 2023-05-25 ENCOUNTER — Encounter: Payer: Self-pay | Admitting: Podiatry

## 2023-05-25 ENCOUNTER — Ambulatory Visit: Payer: 59

## 2023-05-25 ENCOUNTER — Other Ambulatory Visit: Payer: Self-pay | Admitting: Podiatry

## 2023-05-25 ENCOUNTER — Ambulatory Visit (INDEPENDENT_AMBULATORY_CARE_PROVIDER_SITE_OTHER): Payer: 59

## 2023-05-25 ENCOUNTER — Ambulatory Visit (INDEPENDENT_AMBULATORY_CARE_PROVIDER_SITE_OTHER): Payer: 59 | Admitting: Podiatry

## 2023-05-25 DIAGNOSIS — M25572 Pain in left ankle and joints of left foot: Secondary | ICD-10-CM | POA: Diagnosis not present

## 2023-05-25 DIAGNOSIS — S82832A Other fracture of upper and lower end of left fibula, initial encounter for closed fracture: Secondary | ICD-10-CM | POA: Diagnosis not present

## 2023-05-25 MED ORDER — TRAMADOL HCL 50 MG PO TABS: 50.0000 mg | ORAL_TABLET | Freq: Three times a day (TID) | ORAL | 0 refills | Status: AC | PRN

## 2023-05-25 NOTE — Progress Notes (Signed)
  Subjective:  Patient ID: Amber Rivera, female    DOB: [birth year],   MRN: [id number]  No chief complaint on file.   22 y.o. female presents for new concern of left ankle injury that occurred last Friday. Patient relates she was skating and twisted her ankle. Had immediate pain was seen in urgent care and X-rays showed a nondisplaced fracture of the fibula. She was advised to follow-up. Patient is still scheduled for bunion surgery in October.  . Denies any other pedal complaints. Denies n/v/f/c.   Past Medical History:  Diagnosis Date   Anxiety    Depression    Thyroid disease     Objective:  Physical Exam: Vascular: DP/PT pulses 2/4 bilateral. CFT <3 seconds. Normal hair growth on digits. No edema.  Skin. No lacerations or abrasions bilateral feet.  Musculoskeletal: MMT 5/5 bilateral lower extremities in DF, PF, Inversion and Eversion. Deceased ROM in DF of ankle joint. Tender to the lateral malleolus area. No pain medial ankle. Exam limited to pain.  Neurological: Sensation intact to light touch.   Assessment:   1. Other closed fracture of distal end of left fibula, initial encounter      Plan:  Patient was evaluated and treated and all questions answered. -Xrays reviewed. Fracture noted at the distal fibular Weber B type fracture non displace and no changes noted at medial malleolus. Ankle mortise intact.  -Discussed treatement options for non-displaced ankle fracture; risks, alternatives, and benefits explained. -Dispensed CAM boot and NWB for next 4 weeks. Patient to wear at all times and instructed on use -Discussed will continue to plan for bunion surgery in October.  -Recommend protection, rest, ice, elevation daily until symptoms improve -Rx pain med. Tramadol provided.  -Patient to return to office in 4 weeks for serial x-rays to assess healing  or sooner if condition worsens.   Louann Sjogren, DPM

## 2023-06-19 ENCOUNTER — Encounter: Payer: Self-pay | Admitting: Podiatry

## 2023-06-19 ENCOUNTER — Ambulatory Visit: Payer: 59 | Admitting: Podiatry

## 2023-06-19 ENCOUNTER — Ambulatory Visit (INDEPENDENT_AMBULATORY_CARE_PROVIDER_SITE_OTHER): Payer: 59

## 2023-06-19 DIAGNOSIS — M21612 Bunion of left foot: Secondary | ICD-10-CM

## 2023-06-19 DIAGNOSIS — S82832A Other fracture of upper and lower end of left fibula, initial encounter for closed fracture: Secondary | ICD-10-CM

## 2023-06-19 DIAGNOSIS — S82832D Other fracture of upper and lower end of left fibula, subsequent encounter for closed fracture with routine healing: Secondary | ICD-10-CM

## 2023-06-19 NOTE — Progress Notes (Addendum)
  Subjective:  Patient ID: Amber Rivera, female    DOB: 06/22/2001,   MRN: [id number]  Chief Complaint  Patient presents with   Difficulty Walking    RM22:Patient is here for left ankle pain    22 y.o. female presents for follow-up of left ankle fracture and left foot bunion. Relates ankle is doing well has been NWB but has put some weight down and not had any pain. Relates she would like to try and do bunion surgery while she is out for work with her ankle.  Denies any other pedal complaints. Denies n/v/f/c.   Past Medical History:  Diagnosis Date   Anxiety    Depression    Thyroid disease     Objective:  Physical Exam: Vascular: DP/PT pulses 2/4 bilateral. CFT <3 seconds. Normal hair growth on digits. No edema.  Skin. No lacerations or abrasions bilateral feet.  Musculoskeletal: MMT 5/5 bilateral lower extremities in DF, PF, Inversion and Eversion. Deceased ROM in DF of ankle joint. Mildly tender to the lateral malleolus area. No pain medial ankle. 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 Tender to medial eminence. Some pain dorsally to the first MPJ. No pain with ROM of the first MPJ. No hypermobility noted  Neurological: Sensation intact to light touch.   Assessment:   1. Bunion, left   2. Other closed fracture of distal end of left fibula with routine healing, subsequent encounter      Plan:  Patient was evaluated and treated and all questions answered. -Xrays reviewed. Fracture noted at the distal fibular Weber B type fracture non displace and no changes noted at medial malleolus. Ankle mortise intact. 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 No degenerative changes noted at the first MPJ.  -Discussed treatement options for non-displaced ankle fracture; risks, alternatives, and benefits explained. -Continue WBAT in CAM boot. Replacement boot provided as previous is too big and broken.  -Discussed will continue to plan for bunion surgery as she is  improving with ankle fracture and healing we can go ahead and get scheduled for bunion surgery so as to continue her time off work.  -Recommend protection, rest, ice, elevation daily until symptoms improve -Discussed surgical options. Patient appears to have exhausted conservative treatments and is ready to discuss surgery. Discussed in detail austin bunionectomy and possible akin osteotomy. Discussed perioperative course and recovery.  -Informed surgical risk consent was reviewed and read aloud to the patient.  I reviewed the films.  I have discussed my findings with the patient in great detail.  I have discussed all risks including but not limited to infection, stiffness, scarring, limp, disability, deformity, damage to blood vessels and nerves, numbness, poor healing, need for braces, arthritis, chronic pain, amputation, death.  All benefits and realistic expectations discussed in great detail.  I have made no promises as to the outcome.  I have provided realistic expectations.  I have offered the patient a 2nd opinion, which they have declined and assured me they preferred to proceed despite the risks. Did discuss smoking cessation and refraining from nicotine for at least 4 weeks leading up to surgery and 4 weeks after. Discussed risks of smoking on healing process.  Patient expressed understanding.  Will plan for surgery towards the end of September 10th/17th.   Louann Sjogren, DPM

## 2023-06-26 ENCOUNTER — Telehealth: Payer: Self-pay | Admitting: Urology

## 2023-06-26 NOTE — Telephone Encounter (Signed)
DOS - 07/03/23  DOUBLE OSTEOTOMY LEFT --- 66440  [employer]    PER [employer] WEBSITE FOR CPT CODE 34742 HAS BEEN APPROVED, AUTH # [id number], GOOD FROM 06/26/23 - 09/24/23.

## 2023-07-10 ENCOUNTER — Encounter: Payer: 59 | Admitting: Podiatry

## 2023-07-17 ENCOUNTER — Other Ambulatory Visit: Payer: Self-pay | Admitting: Podiatry

## 2023-07-17 DIAGNOSIS — M2012 Hallux valgus (acquired), left foot: Secondary | ICD-10-CM | POA: Diagnosis not present

## 2023-07-17 MED ORDER — OXYCODONE-ACETAMINOPHEN 5-325 MG PO TABS
1.0000 | ORAL_TABLET | ORAL | 0 refills | Status: AC | PRN
Start: 2023-07-17 — End: 2023-07-24

## 2023-07-17 MED ORDER — ONDANSETRON HCL 4 MG PO TABS: 4.0000 mg | ORAL_TABLET | Freq: Three times a day (TID) | ORAL | 0 refills | Status: AC | PRN

## 2023-07-17 MED ORDER — ASPIRIN 81 MG PO TBEC: 81.0000 mg | DELAYED_RELEASE_TABLET | Freq: Two times a day (BID) | ORAL | Status: AC

## 2023-07-17 MED ORDER — CEPHALEXIN 500 MG PO CAPS: 500.0000 mg | ORAL_CAPSULE | Freq: Four times a day (QID) | ORAL | 0 refills | Status: DC

## 2023-07-21 ENCOUNTER — Other Ambulatory Visit: Payer: Self-pay | Admitting: Family Medicine

## 2023-07-21 DIAGNOSIS — F339 Major depressive disorder, recurrent, unspecified: Secondary | ICD-10-CM

## 2023-07-21 DIAGNOSIS — F419 Anxiety disorder, unspecified: Secondary | ICD-10-CM

## 2023-07-24 ENCOUNTER — Ambulatory Visit (INDEPENDENT_AMBULATORY_CARE_PROVIDER_SITE_OTHER): Payer: 59 | Admitting: Podiatry

## 2023-07-24 ENCOUNTER — Ambulatory Visit (INDEPENDENT_AMBULATORY_CARE_PROVIDER_SITE_OTHER): Payer: 59

## 2023-07-24 ENCOUNTER — Encounter: Payer: 59 | Admitting: Podiatry

## 2023-07-24 DIAGNOSIS — M21612 Bunion of left foot: Secondary | ICD-10-CM

## 2023-07-24 DIAGNOSIS — Z9889 Other specified postprocedural states: Secondary | ICD-10-CM

## 2023-07-24 NOTE — Progress Notes (Signed)
  Subjective:  Patient ID: Amber Rivera, female    DOB: August 26, 2001,  MRN: [id number]  No chief complaint on file.   DOS: 07/17/23 Procedure: Left foot bunionectomy  22 y.o. female returns for POV#1. Relates doing well and managing pain  Review of Systems: Negative except as noted in the HPI. Denies N/V/F/Ch.  Past Medical History:  Diagnosis Date   Anxiety    Depression    Thyroid disease     Current Outpatient Medications:    aspirin EC 81 MG tablet, Take 1 tablet (81 mg total) by mouth in the morning and at bedtime. Swallow whole., Disp: , Rfl:    cephALEXin (KEFLEX) 500 MG capsule, Take 1 capsule (500 mg total) by mouth 4 (four) times daily., Disp: 28 capsule, Rfl: 0   cyclobenzaprine (FLEXERIL) 10 MG tablet, Take 1 tablet (10 mg total) by mouth 3 (three) times daily as needed for muscle spasms., Disp: 30 tablet, Rfl: 0   FLUoxetine (PROZAC) 20 MG capsule, Take 1 capsule (20 mg total) by mouth daily. **NEEDS TO BE SEEN BEFORE NEXT REFILL**, Disp: 30 capsule, Rfl: 0   hydrOXYzine (ATARAX) 25 MG tablet, Take 25 mg by mouth daily., Disp: , Rfl:    ibuprofen (ADVIL) 800 MG tablet, Take 1 tablet (800 mg total) by mouth every 8 (eight) hours as needed., Disp: 30 tablet, Rfl: 0   levothyroxine (SYNTHROID) 75 MCG tablet, TAKE 1 TABLET BY MOUTH EVERY DAY, Disp: 90 tablet, Rfl: 2   norelgestromin-ethinyl estradiol (XULANE) 150-35 MCG/24HR transdermal patch, Place 1 patch onto the skin once a week., Disp: 9 patch, Rfl: 1   ondansetron (ZOFRAN) 4 MG tablet, Take 1 tablet (4 mg total) by mouth every 8 (eight) hours as needed for nausea or vomiting., Disp: 20 tablet, Rfl: 0   OXcarbazepine (TRILEPTAL) 150 MG tablet, Take 1 tablet (150 mg total) by mouth 2 (two) times daily. **NEEDS TO BE SEEN BEFORE NEXT REFILL**, Disp: 60 tablet, Rfl: 0   oxyCODONE-acetaminophen (PERCOCET/ROXICET) 5-325 MG tablet, Take 1 tablet by mouth every 4 (four) hours as needed for up to 7 days for severe pain., Disp: 30  tablet, Rfl: 0   QUEtiapine (SEROQUEL) 50 MG tablet, Take 1 tablet (50 mg total) by mouth at bedtime., Disp: 90 tablet, Rfl: 0  Social History   Tobacco Use  Smoking Status Every Day   Current packs/day: 0.25   Average packs/day: 0.3 packs/day for 10.0 years (2.5 ttl pk-yrs)   Types: Cigarettes  Smokeless Tobacco Never    No Known Allergies Objective:  There were no vitals filed for this visit. There is no height or weight on file to calculate BMI. Constitutional Well developed. Well nourished.  Vascular Foot warm and well perfused. Capillary refill normal to all digits.   Neurologic Normal speech. Oriented to person, place, and time. Epicritic sensation to light touch grossly present bilaterally.  Dermatologic Skin healing well without signs of infection. Skin edges well coapted without signs of infection.  Orthopedic: Tenderness to palpation noted about the surgical site.   Radiographs: Toe well aligned and hardware intact Assessment:  No diagnosis found. Plan:  Patient was evaluated and treated and all questions answered.  S/p foot surgery left -Progressing as expected post-operatively. -WB Status: NWB in CAM boot -Sutures: intact. -Medications: n/a -Foot redressed.  Follow-up in 2 weeks for suture removal.   No follow-ups on file.

## 2023-07-30 ENCOUNTER — Ambulatory Visit (INDEPENDENT_AMBULATORY_CARE_PROVIDER_SITE_OTHER): Payer: 59 | Admitting: Family Medicine

## 2023-07-30 ENCOUNTER — Encounter: Payer: Self-pay | Admitting: Family Medicine

## 2023-07-30 VITALS — BP 105/64 | HR 80 | Temp 97.7°F | Ht 62.0 in | Wt 230.0 lb

## 2023-07-30 DIAGNOSIS — M26609 Unspecified temporomandibular joint disorder, unspecified side: Secondary | ICD-10-CM

## 2023-07-30 DIAGNOSIS — F419 Anxiety disorder, unspecified: Secondary | ICD-10-CM | POA: Diagnosis not present

## 2023-07-30 DIAGNOSIS — F339 Major depressive disorder, recurrent, unspecified: Secondary | ICD-10-CM | POA: Diagnosis not present

## 2023-07-30 DIAGNOSIS — Z23 Encounter for immunization: Secondary | ICD-10-CM | POA: Diagnosis not present

## 2023-07-30 DIAGNOSIS — E039 Hypothyroidism, unspecified: Secondary | ICD-10-CM | POA: Diagnosis not present

## 2023-07-30 MED ORDER — QUETIAPINE FUMARATE 100 MG PO TABS
100.0000 mg | ORAL_TABLET | Freq: Every day | ORAL | 0 refills | Status: DC
Start: 2023-07-30 — End: 2023-10-25

## 2023-07-30 MED ORDER — HYDROXYZINE HCL 50 MG PO TABS
50.0000 mg | ORAL_TABLET | Freq: Every day | ORAL | 0 refills | Status: DC
Start: 2023-07-30 — End: 2023-10-25

## 2023-07-30 NOTE — Patient Instructions (Signed)
Jaw Range of Motion Exercises Jaw range of motion exercises are exercises that help your jaw move better. Exercises that help you have good posture (postural exercises) also help relieve jaw discomfort. These are often done along with range of motion exercises. These exercises can help prevent or improve: Trouble opening your mouth. Pain in your jaw while it is open or closed. Temporomandibular joint (TMJ) pain. Headache caused by jaw tension. Take other actions to prevent or relieve jaw pain, such as: Avoiding things that cause or increase jaw pain. This may include: Chewing gum or eating hard foods. Clenching your jaw or teeth, grinding your teeth, or keeping tension in your jaw muscles. Opening your mouth wide, such as for a big yawn. Leaning on your jaw, such as resting your jaw in your hand while leaning on a desk. Putting ice on your jaw. To do this: Put ice in a plastic bag. Place a towel between your skin and the bag. Leave the ice on for 20 minutes, 2-3 times a day. Remove the ice if your skin turns bright red. This is very important. If you cannot feel pain, heat, or cold, you have a greater risk of damage to the area. Only do jaw exercises that your health care provider recommends. Only move your jaw as far as it can comfortably go in each direction. Do not move your jaw into positions that cause pain. Range of motion exercises Repeat each of these exercises 8 times, 1-2 times a day, or as told by your health care provider. Exercise A: Forward protrusion  Push your jaw forward. Hold this position for 1-2 seconds. Let your jaw return to its normal position and rest it there for 1-2 seconds. Exercise B: Controlled opening  Stand or sit in front of a mirror. Place your tongue on the roof of your mouth, just behind your top teeth. Keeping your tongue on the roof of your mouth, slowly open and close your mouth. While you open and close your mouth, watch your jaw in the mirror. Try  to keep your jaw from moving to one side or the other. Exercise C: Right and left motion  Move your jaw right. Hold this position for 1-2 seconds. Let your jaw return to its normal position, and rest it there for 1-2 seconds. Move your jaw left. Hold this position for 1-2 seconds. Let your jaw return to its normal position, and rest it there for 1-2 seconds. Postural exercises Exercise A: Chin tucks  You can do this exercise sitting, standing, or lying down. Move your head straight back, keeping your head level. You can guide the movement by placing your fingers on your chin to push your jaw back in an even motion. You should be able to feel a double chin form at the end of the motion. Hold this position for 5 seconds. Repeat 10-15 times. Exercise B: Shoulder blade squeeze  Sit or stand. Bend your elbows to about 90 degrees, which is the shape of a capital letter "L." Keep your upper arms by your body. Squeeze your shoulder blades down and back, as though you were trying to touch your elbows behind you. Do not shrug your shoulders or move your head. Hold this position for 5 seconds. Repeat 10-15 times. Exercise C: Chest stretch  Stand in a doorway with one of your feet slightly in front of the other. This is called a staggered stance. If you cannot reach your forearms to the door frame, do this exercise in  a corner of a room. Put both of your hands and your forearms on the door frame, with your arms wide apart. Make sure your arms are at a 90-degree angle to your body. Place your hands on the door frame at the height of your elbows. Slowly move your weight onto your front foot until you feel a stretch across your chest and in the front of your shoulders. Keep your head and chest upright and keep your abdominal muscles tight. Do not lean in. Hold this position for 30 seconds. Repeat 3 times. Contact a health care provider if: You have jaw pain that is new or gets worse. You have clicking or  popping sounds while doing the exercises. Get help right away if: Your jaw is stuck in one place and you cannot move it. You cannot open or close your mouth. Summary Jaw range of motion exercises are exercises that help your jaw move better. Take actions to prevent or relieve jaw pain: limit chewing gum or eating hard foods; clenching your jaw or teeth; or leaning on your jaw, such as resting your jaw in your hand while leaning on a desk. Repeat each of the jaw range of motion exercises 8 times, 1-2 times a day, or as told by your health care provider. Contact a health care provider if you have clicking or popping sounds while doing the exercises. This information is not intended to replace advice given to you by your health care provider. Make sure you discuss any questions you have with your health care provider. Document Revised: 05/22/2021 Document Reviewed: 05/22/2021 Elsevier Patient Education  2024 [employer].

## 2023-07-30 NOTE — Progress Notes (Signed)
Established Patient Office Visit  Subjective   Patient ID: Amber Rivera, female    DOB: 2001-07-11  Age: 22 y.o. MRN: [id number]  Chief Complaint  Patient presents with   Medical Management of Chronic Issues    HPI [name] is here for a follow up of chronic issues.   Thyroid Compliant with medications - Yes Current medications - levothyroxine 75 mcg Weight - stable  Bowel habit changes - No Heat or cold intolerance - No Mood changes - No Changes in sleep habits - No Fatigue - No Skin, hair, or nail changes - Yes Tremor - No Palpitations - No Edema - No Shortness of breath - No Changes in menses - No  2. Anxiety/depression She had been seeing BH but stopped seeing her for a few months due to cost. She feels like her symptoms are currently well controlled with her current regimen.  Denies SI. She plans to set up another appt with [facility] but needs a refill of medications to bridge her to an appt.   3. Left ear pain Left ear has been hurting for the last 6 weeks or so. Mostly with movement of jaw. She has had some itching of her ear canal. Has been having issues with TMJ as well. Denies drainage. Reports frequent nasal congestion, cough. She has tried a nightguard with improvement of her jaw pain. No changes in hearing.      01/08/2023   10:55 AM 10/13/2022    1:49 PM 07/14/2022   11:11 AM  Depression screen PHQ 2/9  Decreased Interest 1 1 2   Down, Depressed, Hopeless 1 2 2   PHQ - 2 Score 2 3 4   Altered sleeping 2 1 2   Tired, decreased energy 2 1 2   Change in appetite 2 1 3   Feeling bad or failure about yourself  1 1 2   Trouble concentrating 1 1 2   Moving slowly or fidgety/restless 1 1 1   Suicidal thoughts 0 1 1  PHQ-9 Score 11 10 17   Difficult doing work/chores Somewhat difficult Somewhat difficult Very difficult      01/08/2023   10:55 AM 10/13/2022    1:50 PM 07/14/2022   11:11 AM 05/10/2022   10:17 AM  GAD 7 : Generalized Anxiety Score  Nervous, Anxious, on Edge 2  2 3 2   Control/stop worrying 2 2 3 2   Worry too much - different things 2 3 3 2   Trouble relaxing 1 3 3 2   Restless 1 2 2 1   Easily annoyed or irritable 2 2 2 3   Afraid - awful might happen 1 2 2 2   Total GAD 7 Score 11 16 18 14   Anxiety Difficulty Not difficult at all  Very difficult Very difficult     Past Medical History:  Diagnosis Date   Anxiety    Depression    Thyroid disease       ROS As per HPI.    Objective:     BP 105/64   Pulse 80   Temp 97.7 F (36.5 C) (Temporal)   Ht 5\' 2"  (1.575 m)   Wt 230 lb (104.3 kg)   SpO2 97%   BMI 42.07 kg/m  Wt Readings from Last 3 Encounters:  07/30/23 230 lb (104.3 kg)  01/08/23 227 lb (103 kg)  10/13/22 225 lb (102.1 kg)      Physical Exam Vitals and nursing note reviewed.  Constitutional:      General: She is not in acute distress.    Appearance: She  is obese. She is not ill-appearing, toxic-appearing or diaphoretic.  HENT:     Head:     Jaw: Tenderness (left) present. No trismus, swelling, pain on movement or malocclusion.     Right Ear: Tympanic membrane, ear canal and external ear normal.     Left Ear: Tympanic membrane, ear canal and external ear normal.  Neck:     Thyroid: No thyroid mass, thyromegaly or thyroid tenderness.  Cardiovascular:     Rate and Rhythm: Normal rate and regular rhythm.     Heart sounds: Normal heart sounds. No murmur heard. Pulmonary:     Effort: Pulmonary effort is normal. No respiratory distress.     Breath sounds: Normal breath sounds.  Musculoskeletal:     Right lower leg: No edema.     Left lower leg: No edema.  Skin:    General: Skin is warm and dry.  Neurological:     General: No focal deficit present.     Mental Status: She is alert and oriented to person, place, and time.  Psychiatric:        Mood and Affect: Mood normal.        Behavior: Behavior normal.        Thought Content: Thought content normal.        Judgment: Judgment normal.      No results found  for any visits on 07/30/23.    The ASCVD Risk score (Arnett DK, et al., 2019) failed to calculate for the following reasons:   The 2019 ASCVD risk score is only valid for ages 63 to 57    Assessment & Plan:   Cherae was seen today for medical management of chronic issues.  Diagnoses and all orders for this visit:  Acquired hypothyroidism Compliant with levothyroxine. Labs pending. Denies symptoms.  -     TSH -     CBC with Differential/Platelet -     CMP14+EGFR  Anxiety Depression, recurrent (HCC) Stable symtpoms, denies SI. Will bridge patient with refills until appt with BH.  -     QUEtiapine (SEROQUEL) 100 MG tablet; Take 1 tablet (100 mg total) by mouth at bedtime. -     hydrOXYzine (ATARAX) 50 MG tablet; Take 1 tablet (50 mg total) by mouth daily.  TMJ (temporomandibular joint syndrome) Discussed this is the cause of her ear pain. Discussed treatment with mouthguard, soft foods, NSAIDs, jaw ROM exercises.   Flu vaccine today in office.   Return in about 6 months (around 01/28/2024) for chronic follow up.   The patient indicates understanding of these issues and agrees with the plan.  Gabriel Earing, FNP

## 2023-07-31 LAB — CBC WITH DIFFERENTIAL/PLATELET
Basophils Absolute: 0 10*3/uL (ref 0.0–0.2)
Basos: 1 %
EOS (ABSOLUTE): 0.7 10*3/uL — ABNORMAL HIGH (ref 0.0–0.4)
Eos: 8 %
Hematocrit: 38.5 % (ref 34.0–46.6)
Hemoglobin: 12.8 g/dL (ref 11.1–15.9)
Immature Grans (Abs): 0 10*3/uL (ref 0.0–0.1)
Immature Granulocytes: 0 %
Lymphocytes Absolute: 1.8 10*3/uL (ref 0.7–3.1)
Lymphs: 21 %
MCH: 30.1 pg (ref 26.6–33.0)
MCHC: 33.2 g/dL (ref 31.5–35.7)
MCV: 91 fL (ref 79–97)
Monocytes Absolute: 0.5 10*3/uL (ref 0.1–0.9)
Monocytes: 6 %
Neutrophils Absolute: 5.8 10*3/uL (ref 1.4–7.0)
Neutrophils: 64 %
Platelets: 298 10*3/uL (ref 150–450)
RBC: 4.25 x10E6/uL (ref 3.77–5.28)
RDW: 11.7 % (ref 11.7–15.4)
WBC: 8.8 10*3/uL (ref 3.4–10.8)

## 2023-07-31 LAB — CMP14+EGFR
ALT: 26 IU/L (ref 0–32)
AST: 17 IU/L (ref 0–40)
Albumin: 4.1 g/dL (ref 4.0–5.0)
Alkaline Phosphatase: 70 IU/L (ref 44–121)
BUN/Creatinine Ratio: 17 (ref 9–23)
BUN: 11 mg/dL (ref 6–20)
Bilirubin Total: 0.2 mg/dL (ref 0.0–1.2)
CO2: 20 mmol/L (ref 20–29)
Calcium: 9.3 mg/dL (ref 8.7–10.2)
Chloride: 103 mmol/L (ref 96–106)
Creatinine, Ser: 0.66 mg/dL (ref 0.57–1.00)
Globulin, Total: 2.8 g/dL (ref 1.5–4.5)
Glucose: 78 mg/dL (ref 70–99)
Potassium: 4.7 mmol/L (ref 3.5–5.2)
Sodium: 138 mmol/L (ref 134–144)
Total Protein: 6.9 g/dL (ref 6.0–8.5)
eGFR: 126 mL/min/{1.73_m2} (ref >59)

## 2023-07-31 LAB — TSH: TSH: 5.53 u[IU]/mL — ABNORMAL HIGH (ref 0.450–4.500)

## 2023-08-03 LAB — T4, FREE: Free T4: 0.99 ng/dL (ref 0.82–1.77)

## 2023-08-03 LAB — SPECIMEN STATUS REPORT

## 2023-08-07 ENCOUNTER — Encounter: Payer: 59 | Admitting: Podiatry

## 2023-08-08 ENCOUNTER — Encounter: Payer: Self-pay | Admitting: Podiatry

## 2023-08-08 ENCOUNTER — Ambulatory Visit (INDEPENDENT_AMBULATORY_CARE_PROVIDER_SITE_OTHER): Payer: 59 | Admitting: Podiatry

## 2023-08-08 ENCOUNTER — Ambulatory Visit (INDEPENDENT_AMBULATORY_CARE_PROVIDER_SITE_OTHER): Payer: 59

## 2023-08-08 DIAGNOSIS — M2012 Hallux valgus (acquired), left foot: Secondary | ICD-10-CM

## 2023-08-08 DIAGNOSIS — Z9889 Other specified postprocedural states: Secondary | ICD-10-CM

## 2023-08-08 NOTE — Progress Notes (Signed)
  Subjective:  Patient ID: Amber Rivera, female    DOB: 06-28-2001,  MRN: [id number]  No chief complaint on file.   DOS: 07/17/23 Procedure: Left foot bunionectomy  22 y.o. female returns for POV#2. Relates doing well.  Review of Systems: Negative except as noted in the HPI. Denies N/V/F/Ch.  Past Medical History:  Diagnosis Date   Anxiety    Depression    Thyroid disease     Current Outpatient Medications:    aspirin EC 81 MG tablet, Take 1 tablet (81 mg total) by mouth in the morning and at bedtime. Swallow whole., Disp: , Rfl:    FLUoxetine (PROZAC) 20 MG capsule, Take 1 capsule (20 mg total) by mouth daily. **NEEDS TO BE SEEN BEFORE NEXT REFILL**, Disp: 30 capsule, Rfl: 0   hydrOXYzine (ATARAX) 50 MG tablet, Take 1 tablet (50 mg total) by mouth daily., Disp: 90 tablet, Rfl: 0   ibuprofen (ADVIL) 800 MG tablet, Take 1 tablet (800 mg total) by mouth every 8 (eight) hours as needed., Disp: 30 tablet, Rfl: 0   JUNEL 1.5/30 1.5-30 MG-MCG tablet, Take 1 tablet by mouth daily., Disp: , Rfl:    levothyroxine (SYNTHROID) 75 MCG tablet, TAKE 1 TABLET BY MOUTH EVERY DAY, Disp: 90 tablet, Rfl: 2   ondansetron (ZOFRAN) 4 MG tablet, Take 1 tablet (4 mg total) by mouth every 8 (eight) hours as needed for nausea or vomiting., Disp: 20 tablet, Rfl: 0   OXcarbazepine (TRILEPTAL) 150 MG tablet, Take 1 tablet (150 mg total) by mouth 2 (two) times daily. **NEEDS TO BE SEEN BEFORE NEXT REFILL**, Disp: 60 tablet, Rfl: 0   QUEtiapine (SEROQUEL) 100 MG tablet, Take 1 tablet (100 mg total) by mouth at bedtime., Disp: 90 tablet, Rfl: 0  Social History   Tobacco Use  Smoking Status Every Day   Current packs/day: 0.25   Average packs/day: 0.3 packs/day for 10.0 years (2.5 ttl pk-yrs)   Types: Cigarettes  Smokeless Tobacco Never    No Known Allergies Objective:  There were no vitals filed for this visit. There is no height or weight on file to calculate BMI. Constitutional Well developed. Well  nourished.  Vascular Foot warm and well perfused. Capillary refill normal to all digits.   Neurologic Normal speech. Oriented to person, place, and time. Epicritic sensation to light touch grossly present bilaterally.  Dermatologic Skin healing well without signs of infection. Skin edges well coapted without signs of infection.  Orthopedic: Tenderness to palpation noted about the surgical site.   Radiographs: Toe well aligned and hardware intact Assessment:   1. Post-operative state    Plan:  Patient was evaluated and treated and all questions answered.  S/p foot surgery left -Progressing as expected post-operatively. -WB Status: NWB in CAM boot for one more week. Then may begin WBAT in boot for two weeks.  -Medications: n/a -Foot redressed.  Follow-up in 3 weeks for recheck  No follow-ups on file.

## 2023-08-21 ENCOUNTER — Other Ambulatory Visit: Payer: Self-pay | Admitting: Family Medicine

## 2023-08-21 DIAGNOSIS — F419 Anxiety disorder, unspecified: Secondary | ICD-10-CM

## 2023-08-21 DIAGNOSIS — F339 Major depressive disorder, recurrent, unspecified: Secondary | ICD-10-CM

## 2023-08-21 MED ORDER — FLUOXETINE HCL 20 MG PO CAPS: 20.0000 mg | ORAL_CAPSULE | Freq: Every day | ORAL | 1 refills | Status: DC

## 2023-08-21 MED ORDER — OXCARBAZEPINE 150 MG PO TABS: 150.0000 mg | ORAL_TABLET | Freq: Two times a day (BID) | ORAL | 1 refills | Status: DC

## 2023-08-21 NOTE — Telephone Encounter (Signed)
Amber Rivera pt NTBS 30-d given 07/23/23

## 2023-08-21 NOTE — Telephone Encounter (Signed)
Pt has been seen recently, so nurse is fixing this!

## 2023-08-21 NOTE — Addendum Note (Signed)
Addended by: Julious Payer D on: 08/21/2023 03:20 PM   Modules accepted: Orders

## 2023-08-29 ENCOUNTER — Ambulatory Visit (INDEPENDENT_AMBULATORY_CARE_PROVIDER_SITE_OTHER): Payer: 59

## 2023-08-29 ENCOUNTER — Encounter: Payer: Self-pay | Admitting: Podiatry

## 2023-08-29 ENCOUNTER — Ambulatory Visit (INDEPENDENT_AMBULATORY_CARE_PROVIDER_SITE_OTHER): Payer: 59 | Admitting: Podiatry

## 2023-08-29 DIAGNOSIS — S82832D Other fracture of upper and lower end of left fibula, subsequent encounter for closed fracture with routine healing: Secondary | ICD-10-CM | POA: Diagnosis not present

## 2023-08-29 DIAGNOSIS — Z9889 Other specified postprocedural states: Secondary | ICD-10-CM | POA: Diagnosis not present

## 2023-08-29 DIAGNOSIS — M21612 Bunion of left foot: Secondary | ICD-10-CM

## 2023-08-29 NOTE — Progress Notes (Signed)
  Subjective:  Patient ID: Amber Rivera, female    DOB: 10-Aug-2001,  MRN: [id number]  No chief complaint on file.   DOS: 07/17/23 Procedure: Left foot bunionectomy  22 y.o. female returns for POV#3. Relates doing well.  Review of Systems: Negative except as noted in the HPI. Denies N/V/F/Ch.  Past Medical History:  Diagnosis Date   Anxiety    Depression    Thyroid disease     Current Outpatient Medications:    FLUoxetine (PROZAC) 20 MG capsule, Take 1 capsule (20 mg total) by mouth daily., Disp: 90 capsule, Rfl: 1   hydrOXYzine (ATARAX) 50 MG tablet, Take 1 tablet (50 mg total) by mouth daily., Disp: 90 tablet, Rfl: 0   ibuprofen (ADVIL) 800 MG tablet, Take 1 tablet (800 mg total) by mouth every 8 (eight) hours as needed., Disp: 30 tablet, Rfl: 0   JUNEL 1.5/30 1.5-30 MG-MCG tablet, Take 1 tablet by mouth daily., Disp: , Rfl:    levothyroxine (SYNTHROID) 75 MCG tablet, TAKE 1 TABLET BY MOUTH EVERY DAY, Disp: 90 tablet, Rfl: 2   ondansetron (ZOFRAN) 4 MG tablet, Take 1 tablet (4 mg total) by mouth every 8 (eight) hours as needed for nausea or vomiting., Disp: 20 tablet, Rfl: 0   OXcarbazepine (TRILEPTAL) 150 MG tablet, Take 1 tablet (150 mg total) by mouth 2 (two) times daily., Disp: 180 tablet, Rfl: 1   QUEtiapine (SEROQUEL) 100 MG tablet, Take 1 tablet (100 mg total) by mouth at bedtime., Disp: 90 tablet, Rfl: 0  Social History   Tobacco Use  Smoking Status Every Day   Current packs/day: 0.25   Average packs/day: 0.3 packs/day for 10.0 years (2.5 ttl pk-yrs)   Types: Cigarettes  Smokeless Tobacco Never    No Known Allergies Objective:  There were no vitals filed for this visit. There is no height or weight on file to calculate BMI. Constitutional Well developed. Well nourished.  Vascular Foot warm and well perfused. Capillary refill normal to all digits.   Neurologic Normal speech. Oriented to person, place, and time. Epicritic sensation to light touch grossly present  bilaterally.  Dermatologic Skin healing well without signs of infection. Skin edges well coapted without signs of infection.  Orthopedic: Tenderness to palpation noted about the surgical site.   Radiographs: Toe well aligned and hardware intact Assessment:   1. Post-operative state   2. Bunion, left   3. Other closed fracture of distal end of left fibula with routine healing, subsequent encounter     Plan:  Patient was evaluated and treated and all questions answered.  S/p foot surgery left -Progressing as expected post-operatively. -WB Status: WBAT in regular shoes may begin transition out of boot.  Will dispense an ankle brace to help with ankle instability while transitioning  -Medications: n/a  Follow-up in 6 weeks for recheck  No follow-ups on file.

## 2023-08-31 ENCOUNTER — Other Ambulatory Visit: Payer: Self-pay | Admitting: Nurse Practitioner

## 2023-08-31 DIAGNOSIS — Z30016 Encounter for initial prescription of transdermal patch hormonal contraceptive device: Secondary | ICD-10-CM

## 2023-09-21 ENCOUNTER — Other Ambulatory Visit: Payer: Self-pay | Admitting: Nurse Practitioner

## 2023-09-21 DIAGNOSIS — Z3041 Encounter for surveillance of contraceptive pills: Secondary | ICD-10-CM

## 2023-09-24 NOTE — Telephone Encounter (Signed)
Med refill request: BCPs Last AEX: 09/05/2022-TW Next AEX: recall sent for 2024-nothing scheduled.  Last MMG (if hormonal med): n/a Refill authorized: rx pend.  Msg sent to appt desk to contact pt.

## 2023-09-26 NOTE — Telephone Encounter (Signed)
Patient called back & stated she is only using the birth control pill only & would like a refill. Please approve if appropriate

## 2023-09-26 NOTE — Telephone Encounter (Signed)
Medication refill request: junel 1.5/30 Last AEX:  09-05-22 Next AEX: 12-10-23 Last MMG (if hormonal medication request): none Refill authorized: Patient message from 04-15-23 she asked to be switched from pill to patch. On 07-30-23 patch was discontinued by Darla Lesches LPN. I called patient & left a message for her to call [place] back to see what she is using for birth control

## 2023-10-15 ENCOUNTER — Encounter: Payer: Self-pay | Admitting: Podiatry

## 2023-10-15 ENCOUNTER — Ambulatory Visit (INDEPENDENT_AMBULATORY_CARE_PROVIDER_SITE_OTHER): Payer: 59 | Admitting: Podiatry

## 2023-10-15 DIAGNOSIS — Z9889 Other specified postprocedural states: Secondary | ICD-10-CM

## 2023-10-15 DIAGNOSIS — M21612 Bunion of left foot: Secondary | ICD-10-CM

## 2023-10-15 NOTE — Progress Notes (Signed)
  Subjective:  Patient ID: Amber Rivera, female    DOB: 2001/03/03,  MRN: [id number]  No chief complaint on file.   DOS: 07/17/23 Procedure: Left foot bunionectomy  22 y.o. female returns for POV#4. Relates doing well. Still has some instability around ankle   Review of Systems: Negative except as noted in the HPI. Denies N/V/F/Ch.  Past Medical History:  Diagnosis Date   Anxiety    Depression    Thyroid disease     Current Outpatient Medications:    FLUoxetine (PROZAC) 20 MG capsule, Take 1 capsule (20 mg total) by mouth daily., Disp: 90 capsule, Rfl: 1   hydrOXYzine (ATARAX) 50 MG tablet, Take 1 tablet (50 mg total) by mouth daily., Disp: 90 tablet, Rfl: 0   ibuprofen (ADVIL) 800 MG tablet, Take 1 tablet (800 mg total) by mouth every 8 (eight) hours as needed., Disp: 30 tablet, Rfl: 0   JUNEL 1.5/30 1.5-30 MG-MCG tablet, TAKE 1 TABLET BY MOUTH EVERY DAY, Disp: 84 tablet, Rfl: 0   levothyroxine (SYNTHROID) 75 MCG tablet, TAKE 1 TABLET BY MOUTH EVERY DAY, Disp: 90 tablet, Rfl: 2   ondansetron (ZOFRAN) 4 MG tablet, Take 1 tablet (4 mg total) by mouth every 8 (eight) hours as needed for nausea or vomiting., Disp: 20 tablet, Rfl: 0   OXcarbazepine (TRILEPTAL) 150 MG tablet, Take 1 tablet (150 mg total) by mouth 2 (two) times daily., Disp: 180 tablet, Rfl: 1   QUEtiapine (SEROQUEL) 100 MG tablet, Take 1 tablet (100 mg total) by mouth at bedtime., Disp: 90 tablet, Rfl: 0  Social History   Tobacco Use  Smoking Status Every Day   Current packs/day: 0.25   Average packs/day: 0.3 packs/day for 10.0 years (2.5 ttl pk-yrs)   Types: Cigarettes  Smokeless Tobacco Never    No Known Allergies Objective:  There were no vitals filed for this visit. There is no height or weight on file to calculate BMI. Constitutional Well developed. Well nourished.  Vascular Foot warm and well perfused. Capillary refill normal to all digits.   Neurologic Normal speech. Oriented to person, place, and  time. Epicritic sensation to light touch grossly present bilaterally.  Dermatologic Skin healing well without signs of infection. Skin edges well coapted without signs of infection.  Orthopedic: Tenderness to palpation noted about the surgical site.   Radiographs: Toe well aligned and hardware intact Assessment:   1. Post-operative state   2. Bunion, left      Plan:  Patient was evaluated and treated and all questions answered.  S/p foot surgery left -Progressing as expected post-operatively. -WB Status: WBAT in regular shoes and ankle brace  -Medications: n/a  Patient discharged from a surgical standpoint follow-up as needed.   No follow-ups on file.

## 2023-10-24 ENCOUNTER — Other Ambulatory Visit: Payer: Self-pay | Admitting: Family Medicine

## 2023-10-24 DIAGNOSIS — F419 Anxiety disorder, unspecified: Secondary | ICD-10-CM

## 2023-10-24 DIAGNOSIS — F339 Major depressive disorder, recurrent, unspecified: Secondary | ICD-10-CM

## 2023-10-25 ENCOUNTER — Other Ambulatory Visit: Payer: Self-pay | Admitting: Family Medicine

## 2023-10-25 DIAGNOSIS — F339 Major depressive disorder, recurrent, unspecified: Secondary | ICD-10-CM

## 2023-10-25 DIAGNOSIS — F419 Anxiety disorder, unspecified: Secondary | ICD-10-CM

## 2023-10-25 NOTE — Telephone Encounter (Signed)
 Last office visit 07/30/23 Last refill 07/30/23 #90 1 refill

## 2023-10-25 NOTE — Telephone Encounter (Signed)
 Last office visit 07/30/23 Last refill 07/30/23 #90, no refills

## 2023-10-29 ENCOUNTER — Ambulatory Visit (INDEPENDENT_AMBULATORY_CARE_PROVIDER_SITE_OTHER): Payer: 59 | Admitting: Family Medicine

## 2023-10-29 ENCOUNTER — Encounter: Payer: Self-pay | Admitting: Family Medicine

## 2023-10-29 VITALS — BP 116/75 | HR 73 | Temp 97.9°F | Ht 62.0 in | Wt 260.2 lb

## 2023-10-29 DIAGNOSIS — F419 Anxiety disorder, unspecified: Secondary | ICD-10-CM | POA: Diagnosis not present

## 2023-10-29 DIAGNOSIS — E039 Hypothyroidism, unspecified: Secondary | ICD-10-CM

## 2023-10-29 DIAGNOSIS — F339 Major depressive disorder, recurrent, unspecified: Secondary | ICD-10-CM | POA: Diagnosis not present

## 2023-10-29 DIAGNOSIS — Z0001 Encounter for general adult medical examination with abnormal findings: Secondary | ICD-10-CM | POA: Diagnosis not present

## 2023-10-29 DIAGNOSIS — Z0000 Encounter for general adult medical examination without abnormal findings: Secondary | ICD-10-CM

## 2023-10-29 DIAGNOSIS — L729 Follicular cyst of the skin and subcutaneous tissue, unspecified: Secondary | ICD-10-CM

## 2023-10-29 DIAGNOSIS — Z79899 Other long term (current) drug therapy: Secondary | ICD-10-CM | POA: Diagnosis not present

## 2023-10-29 DIAGNOSIS — L0292 Furuncle, unspecified: Secondary | ICD-10-CM | POA: Insufficient documentation

## 2023-10-29 LAB — BAYER DCA HB A1C WAIVED: HB A1C (BAYER DCA - WAIVED): 4.9 % (ref 4.8–5.6)

## 2023-10-29 MED ORDER — LEVOTHYROXINE SODIUM 75 MCG PO TABS: 75.0000 ug | ORAL_TABLET | Freq: Every day | ORAL | 3 refills | Status: DC

## 2023-10-29 NOTE — Patient Instructions (Signed)

## 2023-10-29 NOTE — Progress Notes (Signed)
 Complete physical exam  Patient: Amber Rivera   DOB: 2001-07-23   22 y.o. Female  MRN: [id number]  Subjective:    Chief Complaint  Patient presents with   Hypothyroidism   Mass    Amber Rivera is a 23 y.o. female who presents today for a complete physical exam. She reports consuming a general diet. The patient does not participate in regular exercise at present. She generally feels well. She reports sleeping well. She does have additional problems to discuss today.   Seeing BH. Recently made changes in her medication regimen and this has been helpful. She feels like her symptoms have been improving with the change.Taking half dose of Seroquel  due to drowsiness. She has a follow up appt in 2 days.   She has a cyst under the skin of her lower abdomen that has been present for years. It has gotten larger and the skin over the cyst has gotten darker. She denies exudate, tenderness, erythema, or warmth. She does has a hx of recurrent boils of her inner thighs for years. These drain at times. Does not currently have any symptoms. There is a family hx of HS. She has learned a lot of the triggers to avoid. Has never seen derm.   She is not fasting today.   She has been out of synthroid  for 1 week.   Most recent fall risk assessment:    10/29/2023   11:31 AM  Fall Risk   Falls in the past year? 0     Most recent depression screenings:    10/29/2023   11:31 AM 07/30/2023    1:29 PM 01/08/2023   10:55 AM  Depression screen PHQ 2/9  Decreased Interest 2 1 1   Down, Depressed, Hopeless 1 1 1   PHQ - 2 Score 3 2 2   Altered sleeping 2 2 2   Tired, decreased energy 2 2 2   Change in appetite 2 1 2   Feeling bad or failure about yourself  2 1 1   Trouble concentrating 1 1 1   Moving slowly or fidgety/restless 0 0 1  Suicidal thoughts 0 0 0  PHQ-9 Score 12 9 11   Difficult doing work/chores Somewhat difficult Somewhat difficult Somewhat difficult      10/29/2023   11:32 AM 07/30/2023    1:39 PM  01/08/2023   10:55 AM 10/13/2022    1:50 PM  GAD 7 : Generalized Anxiety Score  Nervous, Anxious, on Edge 2 2 2 2   Control/stop worrying 2 2 2 2   Worry too much - different things 2 2 2 3   Trouble relaxing 2 2 1 3   Restless 1 1 1 2   Easily annoyed or irritable 2 2 2 2   Afraid - awful might happen 1 0 1 2  Total GAD 7 Score 12 11 11 16   Anxiety Difficulty Somewhat difficult Somewhat difficult Not difficult at all         Patient Care Team: Amber Annabella HERO, FNP as PCP - General (Family Medicine) Amber Annabella LABOR, NP as Nurse Practitioner (Gynecology)   Outpatient Medications Prior to Visit  Medication Sig   hydrOXYzine  (ATARAX ) 50 MG tablet TAKE 1 TABLET BY MOUTH EVERY DAY   JUNEL 1.5/30 1.5-30 MG-MCG tablet TAKE 1 TABLET BY MOUTH EVERY DAY   levothyroxine  (SYNTHROID ) 75 MCG tablet TAKE 1 TABLET BY MOUTH EVERY DAY   ondansetron  (ZOFRAN ) 4 MG tablet Take 1 tablet (4 mg total) by mouth every 8 (eight) hours as needed for nausea or vomiting.  OXcarbazepine  (TRILEPTAL ) 150 MG tablet Take 1 tablet (150 mg total) by mouth 2 (two) times daily.   propranolol  (INDERAL ) 10 MG tablet Take 10 mg by mouth daily as needed.   QUEtiapine  (SEROQUEL ) 100 MG tablet TAKE 1 TABLET BY MOUTH EVERYDAY AT BEDTIME   TRINTELLIX 10 MG TABS tablet Take 10 mg by mouth daily.   [DISCONTINUED] FLUoxetine  (PROZAC ) 20 MG capsule Take 1 capsule (20 mg total) by mouth daily.   [DISCONTINUED] ibuprofen  (ADVIL ) 800 MG tablet Take 1 tablet (800 mg total) by mouth every 8 (eight) hours as needed.   No facility-administered medications prior to visit.    ROS Negative unless specially indicated above in HPI.     Objective:     BP 116/75   Pulse 73   Temp 97.9 F (36.6 C) (Temporal)   Ht 5' 2 (1.575 m)   Wt 260 lb 3.2 oz (118 kg)   SpO2 96%   BMI 47.59 kg/m    Physical Exam Vitals and nursing note reviewed.  Constitutional:      General: She is not in acute distress.    Appearance: She is obese.  She is not ill-appearing, toxic-appearing or diaphoretic.  HENT:     Head: Normocephalic.     Right Ear: Tympanic membrane, ear canal and external ear normal.     Left Ear: Tympanic membrane, ear canal and external ear normal.     Nose: Nose normal.     Mouth/Throat:     Mouth: Mucous membranes are moist.     Pharynx: Oropharynx is clear.  Eyes:     Extraocular Movements: Extraocular movements intact.     Conjunctiva/sclera: Conjunctivae normal.     Pupils: Pupils are equal, round, and reactive to light.  Neck:     Thyroid : No thyroid  mass, thyromegaly or thyroid  tenderness.  Cardiovascular:     Rate and Rhythm: Normal rate and regular rhythm.     Pulses: Normal pulses.     Heart sounds: Normal heart sounds. No murmur heard.    No friction rub. No gallop.  Pulmonary:     Effort: Pulmonary effort is normal.     Breath sounds: Normal breath sounds.  Abdominal:     General: Bowel sounds are normal. There is no distension.     Palpations: Abdomen is soft. There is no mass.     Tenderness: There is no abdominal tenderness. There is no guarding.  Musculoskeletal:        General: No tenderness.     Cervical back: Normal range of motion and neck supple. No tenderness.     Right lower leg: No edema.     Left lower leg: No edema.  Skin:    General: Skin is warm and dry.     Capillary Refill: Capillary refill takes less than 2 seconds.     Comments: 1 cm firm, mobile cyst under skin of center lower abdomen. Mild hyperpigmentation over cyst. No exudate, erythema, warmth, tenderness.   Neurological:     General: No focal deficit present.     Mental Status: She is alert and oriented to person, place, and time.     Cranial Nerves: No cranial nerve deficit.     Sensory: No sensory deficit.     Motor: No weakness.     Coordination: Coordination normal.     Gait: Gait normal.  Psychiatric:        Mood and Affect: Mood normal.        Behavior:  Behavior normal.        Thought Content:  Thought content normal.        Judgment: Judgment normal.      No results found for any visits on 10/29/23.     Assessment & Plan:    Routine Health Maintenance and Physical Exam  Amber Rivera was seen today for hypothyroidism, mass and annual exam.  Diagnoses and all orders for this visit:  Routine general medical examination at a health care facility  Acquired hypothyroidism Has been out of synthroid  x 1 week. Refills provided. TSH elevated. Will recheck TSH in 4-6 weeks after restarting medication.  -     TSH -     levothyroxine  (SYNTHROID ) 75 MCG tablet; Take 1 tablet (75 mcg total) by mouth daily.  Depression, recurrent (HCC) Anxiety Not well controlled but reports improving. Denies SI. Managed by [facility].   High risk medication use EKG with NSR, no QT prolongation today. No previous to compare to. Will check A1c. Reviewed recent CBC and CMP. Will check fasting lipid panel at next appt.  -     Bayer DCA Hb A1c Waived -     EKG 12-Lead  Skin cyst -     Ambulatory referral to Dermatology  Recurrent boils -     Ambulatory referral to Dermatology  Morbid obesity (HCC) Diet, exercise, weight loss.  -     Bayer DCA Hb A1c Waived    Immunization History  Administered Date(s) Administered   Influenza, Seasonal, Injecte, Preservative Fre 07/30/2023   Influenza,inj,Quad PF,6+ Mos 07/14/2022   Moderna Sars-Covid-2 Vaccination 11/05/2020    Health Maintenance  Topic Date Due   HPV VACCINES (1 - 3-dose series) 07/29/2024 (Originally 12/23/2013)   COVID-19 Vaccine (2 - 2024-25 season) 08/14/2024 (Originally 06/24/2023)   Cervical Cancer Screening (Pap smear)  09/05/2025   INFLUENZA VACCINE  Completed   Hepatitis C Screening  Completed   HIV Screening  Completed   DTaP/Tdap/Td  Discontinued    Discussed health benefits of physical activity, and encouraged her to engage in regular exercise appropriate for her age and condition.  Problem List Items Addressed This Visit        Endocrine   Acquired hypothyroidism   Relevant Medications   propranolol  (INDERAL ) 10 MG tablet   levothyroxine  (SYNTHROID ) 75 MCG tablet   Other Relevant Orders   TSH     Musculoskeletal and Integument   Recurrent boils   Relevant Orders   Ambulatory referral to Dermatology     Other   Depression, recurrent (HCC)   Relevant Medications   TRINTELLIX 10 MG TABS tablet   Anxiety   Relevant Medications   TRINTELLIX 10 MG TABS tablet   Morbid obesity (HCC)   Relevant Orders   Bayer DCA Hb A1c Waived   Other Visit Diagnoses       Routine general medical examination at a health care facility    -  Primary     High risk medication use       Relevant Orders   Bayer DCA Hb A1c Waived   EKG 12-Lead (Completed)     Skin cyst       Relevant Orders   Ambulatory referral to Dermatology      Return in 6 months (on 04/27/2024) for chronic follow up, fasting cholesterol .   The patient indicates understanding of these issues and agrees with the plan.  Annabella CHRISTELLA Search, FNP

## 2023-10-30 LAB — TSH: TSH: 8.87 u[IU]/mL — ABNORMAL HIGH (ref 0.450–4.500)

## 2023-10-31 ENCOUNTER — Ambulatory Visit: Payer: 59 | Admitting: Family Medicine

## 2023-11-08 ENCOUNTER — Encounter: Payer: Self-pay | Admitting: *Deleted

## 2023-11-14 ENCOUNTER — Ambulatory Visit: Payer: 59 | Admitting: Nurse Practitioner

## 2023-11-14 NOTE — Progress Notes (Deleted)
   Amber Rivera 11/18/2000 [id number]   History:  23 y.o. G0 presents for annual exam. Monthly cycles. Hypothyroidism managed by PCP. Elevated TSH for a few months. MDD, GAD managed by psychiatry. Gardasil series completed.   Gynecologic History No LMP recorded.   Contraception/Family planning: OCP (estrogen/progesterone) Sexually active: Yes  Health Maintenance Last Pap: 09/05/2022. Results were: LGSIL Last mammogram: Not indicated  Last colonoscopy: Not indicated  Last Dexa: Not indicated   Past medical history, past surgical history, family history and social history were all reviewed and documented in the EPIC chart. Living at home. Has brother and sister.   ROS:  A ROS was performed and pertinent positives and negatives are included.  Exam:  There were no vitals filed for this visit.  There is no height or weight on file to calculate BMI.  General appearance:  Normal Thyroid:  Symmetrical, normal in size, without palpable masses or nodularity. Respiratory  Auscultation:  Clear without wheezing or rhonchi Cardiovascular  Auscultation:  Regular rate, without rubs, murmurs or gallops  Edema/varicosities:  Not grossly evident Abdominal  Soft,nontender, without masses, guarding or rebound.  Liver/spleen:  No organomegaly noted  Hernia:  None appreciated  Skin  Inspection:  Grossly normal Breasts: Not indicated per guidelines Pelvic: External genitalia:  no lesions              Urethra:  normal appearing urethra with no masses, tenderness or lesions              Bartholins and Skenes: normal                 Vagina: normal appearing vagina with normal color and discharge, no lesions              Cervix: no lesions Bimanual Exam:  Uterus:  no masses or tenderness              Adnexa: no mass, fullness, tenderness              Rectovaginal: Deferred              Anus:  normal, no lesions  Patient informed chaperone available to be present for breast and pelvic exam.  Patient has requested no chaperone to be present. Patient has been advised what will be completed during breast and pelvic exam.   Assessment/Plan:  23 y.o. G0 for annual exam.   Well female exam with routine gynecological exam - Education provided on SBEs, importance of preventative screenings, current guidelines, high calcium diet, regular exercise, and multivitamin daily.  Labs with PCP.   Screening for cervical cancer -   No follow-ups on file.    Olivia Mackie DNP, 8:54 AM 11/14/2023

## 2023-12-10 ENCOUNTER — Ambulatory Visit: Payer: 59 | Admitting: Nurse Practitioner

## 2023-12-21 ENCOUNTER — Other Ambulatory Visit: Payer: Self-pay | Admitting: Nurse Practitioner

## 2023-12-21 DIAGNOSIS — Z3041 Encounter for surveillance of contraceptive pills: Secondary | ICD-10-CM

## 2023-12-21 NOTE — Telephone Encounter (Signed)
 Med refill request:  Aurovela 1.5/30 Last AEX: 09/05/22 Next AEX: none scheduled Last MMG (if hormonal med)n/a Refill denied.  Pharmacy notified: needs appointment. Sent to provider for review.

## 2024-02-03 ENCOUNTER — Other Ambulatory Visit: Payer: Self-pay | Admitting: Family Medicine

## 2024-02-03 DIAGNOSIS — E039 Hypothyroidism, unspecified: Secondary | ICD-10-CM

## 2024-02-05 ENCOUNTER — Other Ambulatory Visit: Payer: Self-pay | Admitting: Family Medicine

## 2024-02-05 DIAGNOSIS — F339 Major depressive disorder, recurrent, unspecified: Secondary | ICD-10-CM

## 2024-02-05 DIAGNOSIS — F419 Anxiety disorder, unspecified: Secondary | ICD-10-CM

## 2024-02-19 ENCOUNTER — Other Ambulatory Visit: Payer: Self-pay | Admitting: Family Medicine

## 2024-02-19 DIAGNOSIS — F419 Anxiety disorder, unspecified: Secondary | ICD-10-CM

## 2024-02-19 DIAGNOSIS — F339 Major depressive disorder, recurrent, unspecified: Secondary | ICD-10-CM

## 2024-03-12 ENCOUNTER — Encounter: Payer: Self-pay | Admitting: Nurse Practitioner

## 2024-03-12 ENCOUNTER — Other Ambulatory Visit (HOSPITAL_COMMUNITY)
Admission: RE | Admit: 2024-03-12 | Discharge: 2024-03-12 | Disposition: A | Discharge status: Home / Self Care | Source: Ambulatory Visit | Attending: Nurse Practitioner | Admitting: Nurse Practitioner

## 2024-03-12 ENCOUNTER — Ambulatory Visit (INDEPENDENT_AMBULATORY_CARE_PROVIDER_SITE_OTHER): Admitting: Nurse Practitioner

## 2024-03-12 VITALS — BP 114/74 | HR 82 | Ht 62.25 in | Wt 253.0 lb

## 2024-03-12 DIAGNOSIS — F3281 Premenstrual dysphoric disorder: Secondary | ICD-10-CM

## 2024-03-12 DIAGNOSIS — Z124 Encounter for screening for malignant neoplasm of cervix: Secondary | ICD-10-CM | POA: Diagnosis present

## 2024-03-12 DIAGNOSIS — Z3041 Encounter for surveillance of contraceptive pills: Secondary | ICD-10-CM | POA: Diagnosis not present

## 2024-03-12 DIAGNOSIS — Z1331 Encounter for screening for depression: Secondary | ICD-10-CM

## 2024-03-12 DIAGNOSIS — Z113 Encounter for screening for infections with a predominantly sexual mode of transmission: Secondary | ICD-10-CM | POA: Insufficient documentation

## 2024-03-12 DIAGNOSIS — Z01419 Encounter for gynecological examination (general) (routine) without abnormal findings: Secondary | ICD-10-CM

## 2024-03-12 MED ORDER — NORETHINDRONE ACET-ETHINYL EST 1.5-30 MG-MCG PO TABS: 1.0000 | ORAL_TABLET | Freq: Every day | ORAL | 3 refills | Status: AC

## 2024-03-12 NOTE — Progress Notes (Signed)
 Amber Rivera [birth year] [id number]   History:  23 y.o. G0 presents for annual exam. Monthly cycles. 08/2022 LGSIL. Feels depression is worse during menses. Is considering Nexplanon to see if this works better for her but she is worried about irregular bleeding. Hypothyroidism, GAD, MDD managed by PCP. Has seen psychiatry in the past and plans to find someone new. Gardasil series completed. Would like STD screening today. Declines HIV/RPR.   Gynecologic History Patient's last menstrual period was 03/04/2024 (exact date). Period Cycle (Days): 28 Period Duration (Days): 5 Period Pattern: Regular Menstrual Flow: Light Menstrual Control: Tampon Dysmenorrhea: (!) Mild Dysmenorrhea Symptoms: Cramping Contraception/Family planning: OCP (estrogen/progesterone) Sexually active: Yes  Health Maintenance Last Pap: 09/05/2022. Results were: LGSIL Last mammogram: Not indicated  Last colonoscopy: Not indicated  Last Dexa: Not indicated      03/12/2024    3:03 PM  Depression screen PHQ 2/9  Decreased Interest 1  Down, Depressed, Hopeless 1  PHQ - 2 Score 2  Altered sleeping 2  Tired, decreased energy 1  Change in appetite 1  Feeling bad or failure about yourself  2  Trouble concentrating 1  Moving slowly or fidgety/restless 0  Suicidal thoughts 0  PHQ-9 Score 9  Difficult doing work/chores Somewhat difficult     Past medical history, past surgical history, family history and social history were all reviewed and documented in the EPIC chart. Moved to [place]. Living with boyfriend. Working at [employer].   ROS:  A ROS was performed and pertinent positives and negatives are included.  Exam:  Vitals:   03/12/24 1501  BP: 114/74  Pulse: 82  SpO2: 96%  Weight: 253 lb (114.8 kg)  Height: 5' 2.25" (1.581 m)    Body mass index is 45.9 kg/m.  General appearance:  Normal Thyroid :  Symmetrical, normal in size, without palpable masses or nodularity. Respiratory  Auscultation:   Clear without wheezing or rhonchi Cardiovascular  Auscultation:  Regular rate, without rubs, murmurs or gallops  Edema/varicosities:  Not grossly evident Abdominal  Soft,nontender, without masses, guarding or rebound.  Liver/spleen:  No organomegaly noted  Hernia:  None appreciated  Skin  Inspection:  Grossly normal Breasts: Examined lying and sitting.   Right: Without masses, retractions, nipple discharge or axillary adenopathy.   Left: Without masses, retractions, nipple discharge or axillary adenopathy. Pelvic: External genitalia:  no lesions              Urethra:  normal appearing urethra with no masses, tenderness or lesions              Bartholins and Skenes: normal                 Vagina: normal appearing vagina with normal color and discharge, no lesions              Cervix: no lesions Bimanual Exam:  Uterus:  no masses or tenderness              Adnexa: no mass, fullness, tenderness              Rectovaginal: Deferred              Anus:  normal, no lesions  Patient informed chaperone available to be present for breast and pelvic exam. Patient has requested no chaperone to be present. Patient has been advised what will be completed during breast and pelvic exam.   Assessment/Plan:  23 y.o. G0 for annual exam.   Well female exam with routine  gynecological exam - Education provided on SBEs, importance of preventative screenings, current guidelines, high calcium diet, regular exercise, and multivitamin daily.  Labs with PCP.   Cervical cancer screening - Plan: Cytology - PAP( [facility]). 08/2022 LGSIL.   Positive screening for depression on 9-item Patient Health Questionnaire (PHQ-9) - score of 9. Has seen psychiatry in the past and plans to find someone new. Has tried multiple medications. Feels symptoms are worse during period. Currently taking Seroquel .   Screening examination for STD (sexually transmitted disease) - Plan: Cytology - PAP( ). GC/CG/trich added  to pap. Declines HIV/RPR.   Encounter for surveillance of contraceptive pills - Plan: Norethindrone  Acetate-Ethinyl Estradiol  (JUNEL 1.5/30) 1.5-30 MG-MCG tablet daily.   PMDD (premenstrual dysphoric disorder) - Plan: Norethindrone  Acetate-Ethinyl Estradiol  (JUNEL 1.5/30) 1.5-30 MG-MCG tablet daily. Take continuously.   Return in about 1 year (around 03/12/2025) for Annual.    Andee Bamberger DNP, 3:28 PM 03/12/2024

## 2024-03-20 ENCOUNTER — Ambulatory Visit: Payer: Self-pay | Admitting: Nurse Practitioner

## 2024-03-20 DIAGNOSIS — R87610 Atypical squamous cells of undetermined significance on cytologic smear of cervix (ASC-US): Secondary | ICD-10-CM

## 2024-03-20 LAB — CYTOLOGY - PAP
Chlamydia: NEGATIVE
Comment: NEGATIVE
Comment: NEGATIVE
Comment: NEGATIVE
Comment: NORMAL
Diagnosis: UNDETERMINED — AB
High risk HPV: NEGATIVE
Neisseria Gonorrhea: NEGATIVE
Trichomonas: NEGATIVE

## 2024-03-24 ENCOUNTER — Encounter (HOSPITAL_BASED_OUTPATIENT_CLINIC_OR_DEPARTMENT_OTHER): Payer: Self-pay

## 2024-03-24 ENCOUNTER — Emergency Department (HOSPITAL_BASED_OUTPATIENT_CLINIC_OR_DEPARTMENT_OTHER)
Admission: EM | Admit: 2024-03-24 | Discharge: 2024-03-24 | Disposition: A | Discharge status: Home / Self Care | Attending: Emergency Medicine | Admitting: Emergency Medicine

## 2024-03-24 ENCOUNTER — Emergency Department (HOSPITAL_BASED_OUTPATIENT_CLINIC_OR_DEPARTMENT_OTHER)

## 2024-03-24 ENCOUNTER — Other Ambulatory Visit: Payer: Self-pay

## 2024-03-24 DIAGNOSIS — R109 Unspecified abdominal pain: Secondary | ICD-10-CM | POA: Diagnosis present

## 2024-03-24 DIAGNOSIS — R1030 Lower abdominal pain, unspecified: Secondary | ICD-10-CM | POA: Diagnosis not present

## 2024-03-24 DIAGNOSIS — I880 Nonspecific mesenteric lymphadenitis: Secondary | ICD-10-CM | POA: Insufficient documentation

## 2024-03-24 DIAGNOSIS — E039 Hypothyroidism, unspecified: Secondary | ICD-10-CM | POA: Diagnosis not present

## 2024-03-24 LAB — BASIC METABOLIC PANEL WITH GFR
Anion gap: 15 (ref 5–15)
BUN: 8 mg/dL (ref 6–20)
CO2: 20 mmol/L — ABNORMAL LOW (ref 22–32)
Calcium: 9.4 mg/dL (ref 8.9–10.3)
Chloride: 102 mmol/L (ref 98–111)
Creatinine, Ser: 0.82 mg/dL (ref 0.44–1.00)
GFR, Estimated: >60 mL/min (ref >60)
Glucose, Bld: 92 mg/dL (ref 70–99)
Potassium: 4.1 mmol/L (ref 3.5–5.1)
Sodium: 137 mmol/L (ref 135–145)

## 2024-03-24 LAB — CBC
HCT: 38.3 % (ref 36.0–46.0)
Hemoglobin: 12.6 g/dL (ref 12.0–15.0)
MCH: 29.6 pg (ref 26.0–34.0)
MCHC: 32.9 g/dL (ref 30.0–36.0)
MCV: 89.9 fL (ref 80.0–100.0)
Platelets: 271 10*3/uL (ref 150–400)
RBC: 4.26 MIL/uL (ref 3.87–5.11)
RDW: 12.2 % (ref 11.5–15.5)
WBC: 7.8 10*3/uL (ref 4.0–10.5)
nRBC: 0 % (ref 0.0–0.2)

## 2024-03-24 LAB — URINALYSIS, ROUTINE W REFLEX MICROSCOPIC
Bilirubin Urine: NEGATIVE
Glucose, UA: NEGATIVE mg/dL
Hgb urine dipstick: NEGATIVE
Ketones, ur: NEGATIVE mg/dL
Leukocytes,Ua: NEGATIVE
Nitrite: NEGATIVE
Protein, ur: NEGATIVE mg/dL
Specific Gravity, Urine: 1.009 (ref 1.005–1.030)
pH: 6.5 (ref 5.0–8.0)

## 2024-03-24 LAB — PREGNANCY, URINE: Preg Test, Ur: NEGATIVE

## 2024-03-24 MED ORDER — ONDANSETRON HCL 4 MG/2ML IJ SOLN
4.0000 mg | Freq: Once | INTRAMUSCULAR | Status: AC
  Administered 2024-03-24: 4 mg via INTRAVENOUS
  Filled 2024-03-24: qty 2

## 2024-03-24 MED ORDER — OXYCODONE-ACETAMINOPHEN 5-325 MG PO TABS: 1.0000 | ORAL_TABLET | Freq: Four times a day (QID) | ORAL | 0 refills | Status: AC | PRN

## 2024-03-24 MED ORDER — MORPHINE SULFATE (PF) 4 MG/ML IV SOLN
4.0000 mg | Freq: Once | INTRAVENOUS | Status: AC
  Administered 2024-03-24: 4 mg via INTRAVENOUS
  Filled 2024-03-24: qty 1

## 2024-03-24 NOTE — ED Provider Notes (Signed)
 [place] [facility]GENC[facility] DEPARTMENT AT [facility] Provider Note   CSN: [id number] Arrival date & time: 03/24/24  1726     History Chief Complaint  Patient presents with   Urinary Tract Infection    Amber Rivera is a 23 y.o. female.  Patient with past history significant for hypothyroidism, anxiety, depression, or obesity presents the emergency department today with concerns of UTI.  Reports that she has been treated for UTI for approximately 3 or 4 days.  States she has been taking antibiotic as prescribed and briefly had some improvement but now has developed right flank pain.  Denies any fever, chills, bodies, nausea, or vomiting.  No history of kidney stones or kidney infection.  Denies any blood in her urine.   Urinary Tract Infection Associated symptoms: flank pain        Home Medications Prior to Admission medications   Medication Sig Start Date End Date Taking? Authorizing Provider  oxyCODONE -acetaminophen  (PERCOCET/ROXICET) 5-325 MG tablet Take 1 tablet by mouth every 6 (six) hours as needed for severe pain (pain score 7-10). 03/24/24  Yes Kareema Keitt A, PA-C  hydrOXYzine  (ATARAX ) 50 MG tablet TAKE 1 TABLET BY MOUTH EVERY DAY 02/19/24   Albertha Huger, FNP  levothyroxine  (SYNTHROID ) 75 MCG tablet TAKE 1 TABLET BY MOUTH EVERY DAY 02/04/24   Albertha Huger, FNP  Norethindrone  Acetate-Ethinyl Estradiol  (JUNEL 1.5/30) 1.5-30 MG-MCG tablet Take 1 tablet by mouth daily. Take ACTIVE pills only, skipping PLACEBO pills. 03/12/24   Andee Bamberger, NP  ondansetron  (ZOFRAN ) 4 MG tablet Take 1 tablet (4 mg total) by mouth every 8 (eight) hours as needed for nausea or vomiting. Patient not taking: Reported on 03/12/2024 07/17/23   Sikora, Rebecca, DPM  propranolol  (INDERAL ) 10 MG tablet Take 10 mg by mouth daily as needed. 10/03/23   [provider]  QUEtiapine  (SEROQUEL ) 100 MG tablet TAKE 1 TABLET BY MOUTH EVERYDAY AT BEDTIME 10/25/23   Albertha Huger, FNP   TRINTELLIX 10 MG TABS tablet Take 10 mg by mouth daily. Patient not taking: Reported on 03/12/2024 10/03/23   [provider]      Allergies    Patient has no known allergies.    Review of Systems   Review of Systems  Genitourinary:  Positive for flank pain.  All other systems reviewed and are negative.   Physical Exam Updated Vital Signs BP 114/68   Pulse 78   Temp 98.3 F (36.8 C) (Oral)   Resp 18   Ht 5\' 2"  (1.575 m)   Wt 113.4 kg   LMP 03/04/2024 (Exact Date)   SpO2 98%   BMI 45.73 kg/m  Physical Exam Vitals and nursing note reviewed.  Constitutional:      General: She is not in acute distress.    Appearance: She is well-developed.  HENT:     Head: Normocephalic and atraumatic.  Eyes:     Conjunctiva/sclera: Conjunctivae normal.  Cardiovascular:     Rate and Rhythm: Normal rate and regular rhythm.     Heart sounds: No murmur heard. Pulmonary:     Effort: Pulmonary effort is normal. No respiratory distress.     Breath sounds: Normal breath sounds.  Abdominal:     General: Abdomen is flat. Bowel sounds are normal.     Palpations: Abdomen is soft.     Tenderness: There is abdominal tenderness in the suprapubic area. There is right CVA tenderness. There is no left CVA tenderness.  Musculoskeletal:  General: No swelling.     Cervical back: Neck supple.  Skin:    General: Skin is warm and dry.     Capillary Refill: Capillary refill takes less than 2 seconds.  Neurological:     Mental Status: She is alert.  Psychiatric:        Mood and Affect: Mood normal.     ED Results / Procedures / Treatments   Labs (all labs ordered are listed, but only abnormal results are displayed) Labs Reviewed  BASIC METABOLIC PANEL WITH GFR - Abnormal; Notable for the following components:      Result Value   CO2 20 (*)    All other components within normal limits  URINALYSIS, ROUTINE W REFLEX MICROSCOPIC  PREGNANCY, URINE  CBC     EKG None  Radiology CT Renal Stone Study Result Date: 03/24/2024 CLINICAL DATA:  Abdominal/flank pain, stone suspected Patient reports recent diagnosis of urinary tract infection with increased pain and urinary retention. EXAM: CT ABDOMEN AND PELVIS WITHOUT CONTRAST TECHNIQUE: Multidetector CT imaging of the abdomen and pelvis was performed following the standard protocol without IV contrast. RADIATION DOSE REDUCTION: This exam was performed according to the departmental dose-optimization program which includes automated exposure control, adjustment of the mA and/or kV according to patient size and/or use of iterative reconstruction technique. COMPARISON:  None Available. FINDINGS: Lower chest: No confluent consolidation or pleural effusion. Hepatobiliary: The liver is enlarged spanning 19.1 cm cranial caudal. No evidence of focal liver abnormality on this unenhanced exam. Clips in the gallbladder fossa postcholecystectomy. No biliary dilatation. Pancreas: No ductal dilatation or inflammation. Spleen: Normal in size without focal abnormality. Adrenals/Urinary Tract: No adrenal nodule. No hydronephrosis or renal calculi. No perinephric fat stranding or inflammation. No evidence of intrarenal collection on this unenhanced exam. Decompressed ureters. Urinary bladder is minimally distended. No bladder wall thickening. Stomach/Bowel: Ingested material in the stomach. No bowel obstruction or inflammation. Moderate stool in the right colon. Portions of normal appendix are tentatively visualized. Regardless, no appendicitis. Vascular/Lymphatic: Normal caliber abdominal aorta. Increased number of retroperitoneal and central mesenteric nodes are not enlarged by size criteria. Reproductive: Uterus and bilateral adnexa are unremarkable. Other: No free air or ascites. No abdominal wall hernia. Tiny sebaceous cyst in the subcutaneous tissues of the anterior abdominal wall. Musculoskeletal: There are no acute or  suspicious osseous abnormalities. IMPRESSION: 1. No renal stones or obstructive uropathy. 2. Increased number of small mesenteric and retroperitoneal nodes which may be reactive or secondary to mesenteric adenitis. 3. Hepatomegaly. Electronically Signed   By: Chadwick Colonel M.D.   On: 03/24/2024 20:41    Procedures Procedures    Medications Ordered in ED Medications  morphine (PF) 4 MG/ML injection 4 mg (4 mg Intravenous Given 03/24/24 1947)  ondansetron  (ZOFRAN ) injection 4 mg (4 mg Intravenous Given 03/24/24 1955)    ED Course/ Medical Decision Making/ A&P                                 Medical Decision Making Amount and/or Complexity of Data Reviewed Labs: ordered. Radiology: ordered.  Risk Prescription drug management.   This patient presents to the ED for concern of UTI.  Differential diagnosis includes urolithiasis, pyelonephritis, urosepsis, bowel obstruction, diverticulitis, appendicitis   Lab Tests:  I Ordered, and personally interpreted labs.  The pertinent results include: CBC unremarkable, BMP without any obvious signs of renal dysfunction, urine pregnancy negative, UA negative   Imaging Studies  ordered:  I ordered imaging studies including CT renal stone study I independently visualized and interpreted imaging which showed no renal stones or obstructive uropathy. Increased number of small mesenteric and retroperitoneal nodes which may be reactive or secondary to mesenteric adenitis. Hepatomegaly. I agree with the radiologist interpretation   Medicines ordered and prescription drug management:  I ordered medication including morphine for pain Reevaluation of the patient after these medicines showed that the patient improved I have reviewed the patients home medicines and have made adjustments as needed   Problem List / ED Course:  Patient with past history significant for anxiety, depression, hypothyroidism, obesity presents ED today with concerns of UTI.   States that she was seen in urgent care a few days ago and started on Bactrim.  She reports she has been using this medication as prescribed with minimal improvement in symptoms.  She does report that she initially had improvement in symptoms but symptoms have now relapsed.  Denies any nausea, vomiting, or fevers.  No history of kidney stone or kidney infection. Notably on exam, patient has right CVA tenderness.  Left CVA tenderness not present.  No focal abdominal tenderness with exception of suprapubic pain which is consistent with likely cystitis.  Given flank tenderness, will proceed with CT renal stone study for assessment possible kidney stone versus other cause of pain.  Morphine ordered for pain management. Lab workup is reassuring.  No obvious signs of infection concerning based on urinalysis results today.  CT renal stone study shows findings consistent with mesenteric adenitis.  No signs of urolithiasis, pyelonephritis, or other renal abnormality.  I do suspect her symptoms are likely secondary to mesenteric tendinitis probably in response to a recently diagnosed UTI.  Given that she is not septic or ill-appearing, do not feel that she would benefit from antibiotic initiation at this time.  Will discharge home with a course of Percocet for pain management but advised to only use this for severe pain.  Encouraged use of Tylenol  ibuprofen  for mild to moderate pain.  Return precautions discussed such as with concerns for new or worsening symptoms.  Patient otherwise stable at this time for discharge home and outpatient follow-up.  Final Clinical Impression(s) / ED Diagnoses Final diagnoses:  Mesenteric adenitis    Rx / DC Orders ED Discharge Orders          Ordered    oxyCODONE -acetaminophen  (PERCOCET/ROXICET) 5-325 MG tablet  Every 6 hours PRN        03/24/24 2054              Elsworth Ledin A, PA-C 03/24/24 2059    Rosealee Concha, MD 03/25/24 0155

## 2024-03-24 NOTE — ED Triage Notes (Signed)
 Pt reports being treated for bladder infection x1 week. Pt started Bactrim x4 days. Pt reports increased pain and urinary retention.

## 2024-03-24 NOTE — ED Notes (Signed)
 Patient transported to CT

## 2024-03-24 NOTE — Discharge Instructions (Addendum)
 You were seen in the ER today for concerns about possible complications from a urinary tract infection.  Your urinalysis was thankfully reassuring with what appears to be likely improving urinary infection.  Your labs are also thankfully reassuring.  He had a CT scan performed for assessment of possible kidney stone or appendicitis which did not reveal either of these findings.  However, did show findings of mesenteric adenitis which is an inflammatory process of your lymph nodes in your abdomen.  This is a benign condition however the pain can oftentimes mimic appendicitis.  I have sent you home with pain medicine for continued support of this pain but there is no need for any other specific treatment.  Please continue to monitor your symptoms return to the emergency department for any concerns of new or worsening symptoms.  Otherwise, please follow-up with your primary care provider.

## 2024-04-07 ENCOUNTER — Encounter: Admitting: Nurse Practitioner

## 2024-04-07 NOTE — Progress Notes (Deleted)
    Patient ID: Kween Bacorn, female    DOB: November 10, 2000, 23 y.o.   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  The risks and benefits of the procedure and Written informed consent obtained. Timeout performed.  Speculum placed in vagina and excellent visualization of cervix achieved, cervix swabbed x 3 with acetic acid solution.  White light and green light filter used.   Impression: ***  Satisfactory (ECC zone seen): ***  Findings:  Cervix colposcopy biopsy taken: *** O'clock for  *** O'clock for *** O'clock for  ECC performed with endocervical curette   Hemostasis obtained with application of Monsel's Solution  Complications:    Patient tolerated the procedure well  Plan:  To notify patient in epic and phone call of results and plan of care Discussed she must have two consecutive normal pap smears Q38months before returning to annual pap smears Counseled on the importance of gardesil vaccination   Andee Bamberger DNP, 7:59 AM 04/07/2024

## 2024-05-13 ENCOUNTER — Encounter: Admitting: Nurse Practitioner

## 2024-05-14 ENCOUNTER — Encounter: Admitting: Nurse Practitioner

## 2024-06-03 ENCOUNTER — Encounter: Payer: Self-pay | Admitting: Nurse Practitioner

## 2024-06-09 ENCOUNTER — Telehealth: Payer: Self-pay | Admitting: Nurse Practitioner

## 2024-06-09 NOTE — Telephone Encounter (Signed)
 Two messages were left and letter mailed asking patient to call and reschedule colposcopy. Patient has not called to reschedule.

## 2024-06-11 NOTE — Telephone Encounter (Signed)
 Left message to call Kate, RN at [place], [phone], option 4 in regard to colposcopy .

## 2024-07-04 NOTE — Telephone Encounter (Signed)
 No response from patient, letter pended, copy to Tiffany to review to be signed and mailed.

## 2024-07-09 NOTE — Telephone Encounter (Signed)
Letter signed and mailed to patient.

## 2024-10-03 DIAGNOSIS — Z419 Encounter for procedure for purposes other than remedying health state, unspecified: Secondary | ICD-10-CM | POA: Diagnosis not present

## 2024-11-14 NOTE — Addendum Note (Signed)
 Addended by: BRUTUS KATE SAILOR on: 11/14/2024 09:30 AM   Modules accepted: Orders
